# Patient Record
Sex: Female | Born: 1980 | Race: White | Hispanic: No | Marital: Married | Smoking: Never smoker
Health system: Southern US, Community
[De-identification: ages and names within clinical notes are randomized; demographics above are authoritative.]

## PROBLEM LIST (undated history)

## (undated) DIAGNOSIS — N390 Urinary tract infection, site not specified: Secondary | ICD-10-CM

## (undated) DIAGNOSIS — K429 Umbilical hernia without obstruction or gangrene: Secondary | ICD-10-CM

## (undated) DIAGNOSIS — G43909 Migraine, unspecified, not intractable, without status migrainosus: Secondary | ICD-10-CM

## (undated) DIAGNOSIS — F419 Anxiety disorder, unspecified: Secondary | ICD-10-CM

## (undated) HISTORY — DX: Migraine, unspecified, not intractable, without status migrainosus: G43.909

## (undated) HISTORY — PX: SHOULDER SURGERY: SHX246

## (undated) HISTORY — DX: Umbilical hernia without obstruction or gangrene: K42.9

## (undated) HISTORY — DX: Anxiety disorder, unspecified: F41.9

## (undated) HISTORY — DX: Urinary tract infection, site not specified: N39.0

## (undated) HISTORY — PX: HAND SURGERY: SHX662

## (undated) HISTORY — PX: TUBAL LIGATION: SHX77

## (undated) HISTORY — PX: ABDOMINAL SURGERY: SHX537

---
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
## 2004-01-24 ENCOUNTER — Ambulatory Visit (HOSPITAL_COMMUNITY): Admission: RE | Admit: 2004-01-24 | Discharge: 2004-01-24 | Payer: Self-pay | Admitting: Family Medicine

## 2004-03-19 ENCOUNTER — Ambulatory Visit (HOSPITAL_BASED_OUTPATIENT_CLINIC_OR_DEPARTMENT_OTHER): Admission: RE | Admit: 2004-03-19 | Discharge: 2004-03-19 | Payer: Self-pay | Admitting: Orthopedic Surgery

## 2015-12-12 ENCOUNTER — Encounter: Payer: Self-pay | Admitting: *Deleted

## 2015-12-25 ENCOUNTER — Ambulatory Visit: Payer: Self-pay | Admitting: General Surgery

## 2016-01-02 ENCOUNTER — Encounter: Payer: Self-pay | Admitting: General Surgery

## 2016-01-02 ENCOUNTER — Ambulatory Visit (INDEPENDENT_AMBULATORY_CARE_PROVIDER_SITE_OTHER): Payer: BLUE CROSS/BLUE SHIELD | Admitting: General Surgery

## 2016-01-02 VITALS — BP 128/86 | HR 82 | Resp 12 | Ht 65.0 in | Wt 129.0 lb

## 2016-01-02 DIAGNOSIS — K429 Umbilical hernia without obstruction or gangrene: Secondary | ICD-10-CM | POA: Diagnosis not present

## 2016-01-02 DIAGNOSIS — K469 Unspecified abdominal hernia without obstruction or gangrene: Secondary | ICD-10-CM | POA: Insufficient documentation

## 2016-01-02 NOTE — Patient Instructions (Addendum)
The patient is aware to call back for any questions or concerns. Hernia, Adult A hernia is the bulging of an organ or tissue through a weak spot in the muscles of the abdomen (abdominal wall). Hernias develop most often near the navel or groin. There are many kinds of hernias. Common kinds include:  Femoral hernia. This kind of hernia develops under the groin in the upper thigh area.  Inguinal hernia. This kind of hernia develops in the groin or scrotum.  Umbilical hernia. This kind of hernia develops near the navel.  Hiatal hernia. This kind of hernia causes part of the stomach to be pushed up into the chest.  Incisional hernia. This kind of hernia bulges through a scar from an abdominal surgery. CAUSES This condition may be caused by:  Heavy lifting.  Coughing over a long period of time.  Straining to have a bowel movement.  An incision made during an abdominal surgery.  A birth defect (congenital defect).  Excess weight or obesity.  Smoking.  Poor nutrition.  Cystic fibrosis.  Excess fluid in the abdomen.  Undescended testicles. SYMPTOMS Symptoms of a hernia include:  A lump on the abdomen. This is the first sign of a hernia. The lump may become more obvious with standing, straining, or coughing. It may get bigger over time if it is not treated or if the condition causing it is not treated.  Pain. A hernia is usually painless, but it may become painful over time if treatment is delayed. The pain is usually dull and may get worse with standing or lifting heavy objects. Sometimes a hernia gets tightly squeezed in the weak spot (strangulated) or stuck there (incarcerated) and causes additional symptoms. These symptoms may include:  Vomiting.  Nausea.  Constipation.  Irritability. DIAGNOSIS A hernia may be diagnosed with:  A physical exam. During the exam your health care provider may ask you to cough or to make a specific movement, because a hernia is usually  more visible when you move.  Imaging tests. These can include:  X-rays.  Ultrasound.  CT scan. TREATMENT A hernia that is small and painless may not need to be treated. A hernia that is large or painful may be treated with surgery. Inguinal hernias may be treated with surgery to prevent incarceration or strangulation. Strangulated hernias are always treated with surgery, because lack of blood to the trapped organ or tissue can cause it to die. Surgery to treat a hernia involves pushing the bulge back into place and repairing the weak part of the abdomen. HOME CARE INSTRUCTIONS  Avoid straining.  Do not lift anything heavier than 10 lb (4.5 kg).  Lift with your leg muscles, not your back muscles. This helps avoid strain.  When coughing, try to cough gently.  Prevent constipation. Constipation leads to straining with bowel movements, which can make a hernia worse or cause a hernia repair to break down. You can prevent constipation by:  Eating a high-fiber diet that includes plenty of fruits and vegetables.  Drinking enough fluids to keep your urine clear or pale yellow. Aim to drink 6-8 glasses of water per day.  Using a stool softener as directed by your health care provider.  Lose weight, if you are overweight.  Do not use any tobacco products, including cigarettes, chewing tobacco, or electronic cigarettes. If you need help quitting, ask your health care provider.  Keep all follow-up visits as directed by your health care provider. This is important. Your health care provider may   need to monitor your condition. SEEK MEDICAL CARE IF:  You have swelling, redness, and pain in the affected area.  Your bowel habits change. SEEK IMMEDIATE MEDICAL CARE IF:  You have a fever.  You have abdominal pain that is getting worse.  You feel nauseous or you vomit.  You cannot push the hernia back in place by gently pressing on it while you are lying down.  The hernia:  Changes in  shape or size.  Is stuck outside the abdomen.  Becomes discolored.  Feels hard or tender.   This information is not intended to replace advice given to you by your health care provider. Make sure you discuss any questions you have with your health care provider.   Document Released: 09/16/2005 Document Revised: 10/07/2014 Document Reviewed: 07/27/2014 Elsevier Interactive Patient Education 2016 [employer]  

## 2016-01-02 NOTE — Progress Notes (Signed)
Patient ID: Colleen DownsCaroline C Taylor, female   DOB: 09-02-1981, 35 y.o.   MRN: [id number]  Chief Complaint  Patient presents with  . Hernia    HPI Colleen Taylor is a 35 y.o. female.  Here today for evaluation of an umbilical hernia. She noticed this bulge after her 2 pregnancy in 2013. She did have some pain postpartum but none since. Bowels are regular. She has 4 children- 1 set of twins with this last pregnancy 2014. She feels it may be less noticeable. She is considering a tummy tuck in the future.  HPI  Past Medical History  Diagnosis Date  . Anxiety     Past Surgical History  Procedure Laterality Date  . Cesarean section  2010, 2014  . Breast reduction surgery  2002    No family history on file.  Social History Social History  Substance Use Topics  . Smoking status: Never Smoker   . Smokeless tobacco: Never Used  . Alcohol Use: 0.0 oz/week    0 Standard drinks or equivalent per week     Comment: occasionally    No Known Allergies  Current Outpatient Prescriptions  Medication Sig Dispense Refill  . LORazepam (ATIVAN) 0.5 MG tablet Take 0.5 mg by mouth every 12 (twelve) hours as needed for anxiety.    . Multiple Vitamin (MULTIVITAMIN) capsule Take 1 capsule by mouth daily.     No current facility-administered medications for this visit.    Review of Systems Review of Systems  Constitutional: Negative.   Respiratory: Negative.   Cardiovascular: Negative.     Blood pressure 128/86, pulse 82, resp. rate 12, height 5\' 5"  (1.651 m), weight 129 lb (58.514 kg), last menstrual period 12/13/2015.  Physical Exam Physical Exam  Constitutional: She is oriented to person, place, and time. She appears well-developed and well-nourished.  HENT:  Mouth/Throat: Oropharynx is clear and moist.  Eyes: Conjunctivae are normal. No scleral icterus.  Neck: Neck supple.  Cardiovascular: Normal rate, regular rhythm and normal heart sounds.   Pulmonary/Chest: Breath sounds normal.   Abdominal: Soft. Normal appearance and bowel sounds are normal. There is no tenderness. A hernia is present.    Umbilical hernia present.  Lymphadenopathy:    She has no cervical adenopathy.  Neurological: She is oriented to person, place, and time.  Skin: Skin is warm and dry.  Psychiatric: Her behavior is normal.     Assessment    Small epigastric/umbilical hernia. No risk for incarceration.    Plan    The patient is going to investigate removal of the redundant skin. If this is undertaken, its possible at the small fascial defect could be closed at that time. If she elects not to undergo plastic surgery and she desires to have the hernia repaired she'll be contacting the office for surgical scheduling.      Dr. Meriam Spragueoan for plastic surgery consult.   PCP:  No Pcp Per Patient Ref Helmut Musterlicia Copland  This information has been scribed by Dorathy DaftMarsha Hatch RN, BSN,BC.     [name], Kohan Azizi W 01/02/2016, 4:07 PM

## 2016-01-06 DIAGNOSIS — J029 Acute pharyngitis, unspecified: Secondary | ICD-10-CM | POA: Diagnosis not present

## 2016-01-06 DIAGNOSIS — J0140 Acute pansinusitis, unspecified: Secondary | ICD-10-CM | POA: Diagnosis not present

## 2016-09-30 HISTORY — PX: OTHER SURGICAL HISTORY: SHX169

## 2016-11-13 DIAGNOSIS — J019 Acute sinusitis, unspecified: Secondary | ICD-10-CM | POA: Diagnosis not present

## 2016-12-13 ENCOUNTER — Ambulatory Visit: Payer: BLUE CROSS/BLUE SHIELD | Admitting: Obstetrics and Gynecology

## 2017-01-14 ENCOUNTER — Encounter: Payer: Self-pay | Admitting: Obstetrics and Gynecology

## 2017-01-14 ENCOUNTER — Ambulatory Visit (INDEPENDENT_AMBULATORY_CARE_PROVIDER_SITE_OTHER): Payer: BLUE CROSS/BLUE SHIELD | Admitting: Obstetrics and Gynecology

## 2017-01-14 VITALS — BP 120/80 | HR 80 | Ht 65.0 in | Wt 135.0 lb

## 2017-01-14 DIAGNOSIS — R1032 Left lower quadrant pain: Secondary | ICD-10-CM

## 2017-01-14 DIAGNOSIS — N6310 Unspecified lump in the right breast, unspecified quadrant: Secondary | ICD-10-CM

## 2017-01-14 DIAGNOSIS — Z01419 Encounter for gynecological examination (general) (routine) without abnormal findings: Secondary | ICD-10-CM

## 2017-01-14 DIAGNOSIS — O30009 Twin pregnancy, unspecified number of placenta and unspecified number of amniotic sacs, unspecified trimester: Secondary | ICD-10-CM | POA: Insufficient documentation

## 2017-01-14 NOTE — Progress Notes (Addendum)
Chief Complaint  Patient presents with  . Gynecologic Exam     HPI:      Ms. Colleen Taylor is a 36 y.o. G3P3 who LMP was Patient's last menstrual period was 12/22/2016., presents today for her annual examination.  Her menses are regular every 28-30 days, lasting 8 days max total (has some light spotting at end of cycle).  Dysmenorrhea none. She does not have intermenstrual bleeding.  Sex activity: single partner, contraception - tubal ligation.  Last Pap: December 11, 2015  Results were: no abnormalities /neg HPV DNA  Hx of STDs: none  There is no FH of breast cancer. There is no FH of ovarian cancer. The patient does not do self-breast exams. 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  Tobacco use: The patient denies current or previous tobacco use. Alcohol use: social drinker Exercise: moderately active  She does get adequate calcium and Vitamin D in her diet.  She is s/p umb hernial repair and "tummy tuck" 1/18 with Dr. Lemar Livings. She has done really well. She sometimes has muscle pulling sensation BLQ when she gets up too quickly. She also notes occas, intermittent LLQ throbbing pain for about a day randomly. She has not kept journal to see if correlates with her cycle.   Past Medical History:  Diagnosis Date  . Anxiety     Past Surgical History:  Procedure Laterality Date  . ABDOMINAL SURGERY    . BREAST REDUCTION SURGERY  2002  . CESAREAN SECTION  2010, 2014  . HAND SURGERY    . SHOULDER SURGERY    . TUBAL LIGATION      No family history on file.  Social History   Social History  . Marital status: Married    Spouse name: N/A  . Number of children: N/A  . Years of education: N/A   Occupational History  . Not on file.   Social History Main Topics  . Smoking status: Never Smoker  . Smokeless tobacco: Never Used  . Alcohol use 0.0 oz/week     Comment: occasionally  . Drug use: No  . Sexual activity: Yes    Birth control/  protection: Surgical   Other Topics Concern  . Not on file   Social History Narrative  . No narrative on file     Current Outpatient Prescriptions:  .  LORazepam (ATIVAN) 0.5 MG tablet, Take 0.5 mg by mouth every 12 (twelve) hours as needed for anxiety., Disp: , Rfl:  .  Multiple Vitamin (MULTIVITAMIN) capsule, Take 1 capsule by mouth daily., Disp: , Rfl:   ROS:  Review of Systems  Constitutional: Negative for fever, malaise/fatigue and weight loss.  HENT: Negative for congestion, ear pain and sinus pain.   Respiratory: Negative for cough, shortness of breath and wheezing.   Cardiovascular: Negative for chest pain, orthopnea and leg swelling.  Gastrointestinal: Negative for constipation, diarrhea, nausea and vomiting.  Genitourinary: Negative for dysuria, frequency, hematuria and urgency.       Breast ROS: mass   Musculoskeletal: Negative for back pain, joint pain and myalgias.  Skin: Negative for itching and rash.  Neurological: Negative for dizziness, tingling, focal weakness and headaches.  Endo/Heme/Allergies: Negative for environmental allergies. Does not bruise/bleed easily.  Psychiatric/Behavioral: Negative for depression and suicidal ideas. The patient is not nervous/anxious and does not have insomnia.     Objective: BP 120/80   Pulse 80   Ht  (1.651 m)  Wt 135 lb (61.2 kg)   LMP 12/22/2016   BMI 22.47 kg/m    Physical Exam  Constitutional: She is oriented to person, place, and time. She appears well-developed and well-nourished.  Genitourinary: Vagina normal and uterus normal. No erythema or tenderness in the vagina. No vaginal discharge found. Right adnexum does not display mass and does not display tenderness. Left adnexum does not display mass and does not display tenderness. Cervix does not exhibit motion tenderness or polyp. Uterus is not enlarged or tender.  Neck: Normal range of motion. No thyromegaly present.  Cardiovascular: Normal rate, regular  rhythm and normal heart sounds.   No murmur heard. Pulmonary/Chest: Effort normal and breath sounds normal. Right breast exhibits mass. Right breast exhibits no nipple discharge, no skin change and no tenderness. Left breast exhibits no mass, no nipple discharge, no skin change and no tenderness.    RT breast 2:00 position with 1 cm firm, mobile, NT mass (pt states there is no change)  Abdominal: Soft. There is no tenderness. There is no guarding.  Musculoskeletal: Normal range of motion.  Neurological: She is alert and oriented to person, place, and time. No cranial nerve deficit.  Psychiatric: She has a normal mood and affect. Her behavior is normal.  Vitals reviewed.   Assessment/Plan: Encounter for annual routine gynecological examination  LLQ pain - Sx last about a day intermittently. See if correlates with ovulation. F/u prn sx for u/s.  Breast mass, right - Stable RT breast fiboradenoma. F/u prn changes.             GYN counsel adequate intake of calcium and vitamin D, diet and exercise     F/U  Return in about 1 year (around 01/14/2018).  Rien Marland B. Rayshell Goecke, PA-C 01/15/2017 1:50 PM

## 2017-12-08 ENCOUNTER — Encounter: Payer: Self-pay | Admitting: Obstetrics and Gynecology

## 2017-12-08 ENCOUNTER — Ambulatory Visit: Payer: BLUE CROSS/BLUE SHIELD | Admitting: Obstetrics and Gynecology

## 2017-12-08 VITALS — BP 110/84 | HR 87 | Ht 65.0 in | Wt 138.0 lb

## 2017-12-08 DIAGNOSIS — G43839 Menstrual migraine, intractable, without status migrainosus: Secondary | ICD-10-CM | POA: Diagnosis not present

## 2017-12-08 NOTE — Progress Notes (Signed)
Chief Complaint  Patient presents with  . Menstrual Problem    Migraines     HPI:      Ms. Colleen Taylor is a 37 y.o. G3P3 who LMP was Patient's last menstrual period was 11/13/2017 (exact date)., presents today for menstrual migraines for the past yr, but they're getting more intense. She notes sx either with start of menses or around day 3-5 of cycle. Has n/v/photo/phonophobia.  She takes NSAIDs without relief. Sx are about 1-2 days and can be debilitating to pt. She notes improvement with exercise throughout the month/increased fluids with menses but this isn't always feasible.She has had headaches in the past, but not migraines. She did OCPs in the past with increased headaches. FH migraines in her mom.    Past Medical History:  Diagnosis Date  . Anxiety   . Umbilical hernia     Past Surgical History:  Procedure Laterality Date  . ABDOMINAL SURGERY    . BREAST REDUCTION SURGERY  2002  . CESAREAN SECTION  2010, 2014  . HAND SURGERY    . SHOULDER SURGERY    . TUBAL LIGATION      Family History  Problem Relation Age of Onset  . Hypertension Mother   . Hypertension Father     Social History   Socioeconomic History  . Marital status: Married    Spouse name: Not on file  . Number of children: Not on file  . Years of education: Not on file  . Highest education level: Not on file  Social Needs  . Financial resource strain: Not on file  . Food insecurity - worry: Not on file  . Food insecurity - inability: Not on file  . Transportation needs - medical: Not on file  . Transportation needs - non-medical: Not on file  Occupational History  . Not on file  Tobacco Use  . Smoking status: Never Smoker  . Smokeless tobacco: Never Used  Substance and Sexual Activity  . Alcohol use: Yes    Alcohol/week: 0.0 oz    Comment: occasionally  . Drug use: No  . Sexual activity: Yes    Birth control/protection: Surgical    Comment: Tubal  Other Topics Concern  . Not on file    Social History Narrative  . Not on file     Current Outpatient Medications:  .  LORazepam (ATIVAN) 0.5 MG tablet, Take 0.5 mg by mouth every 12 (twelve) hours as needed for anxiety., Disp: , Rfl:  .  Multiple Vitamin (MULTIVITAMIN) capsule, Take 1 capsule by mouth daily., Disp: , Rfl:    ROS:  Review of Systems  Constitutional: Negative for fatigue, fever and unexpected weight change.  Respiratory: Negative for cough, shortness of breath and wheezing.   Cardiovascular: Negative for chest pain, palpitations and leg swelling.  Gastrointestinal: Negative for blood in stool, constipation, diarrhea, nausea and vomiting.  Endocrine: Negative for cold intolerance, heat intolerance and polyuria.  Genitourinary: Negative for dyspareunia, dysuria, flank pain, frequency, genital sores, hematuria, menstrual problem, pelvic pain, urgency, vaginal bleeding, vaginal discharge and vaginal pain.  Musculoskeletal: Negative for back pain, joint swelling and myalgias.  Skin: Negative for rash.  Neurological: Positive for headaches. Negative for dizziness, syncope, light-headedness and numbness.  Hematological: Negative for adenopathy.  Psychiatric/Behavioral: Negative for agitation, confusion, sleep disturbance and suicidal ideas. The patient is not nervous/anxious.      OBJECTIVE:   Vitals:  BP 110/84   Pulse 87   Ht 5\' 5"  (1.651 m)  Wt 138 lb (62.6 kg)   LMP 11/13/2017 (Exact Date)   BMI 22.96 kg/m   Physical Exam  Constitutional: She is oriented to person, place, and time and well-developed, well-nourished, and in no distress.  Pulmonary/Chest: Effort normal.  Musculoskeletal: Normal range of motion.  Neurological: She is alert and oriented to person, place, and time.  Psychiatric: Memory, affect and judgment normal.  Vitals reviewed.  Assessment/Plan: Intractable menstrual migraine without status migrainosus - Given new onset of sx, suggested pt have PCP eval. Had increased  headaches in past with OCPs. Prob better treated with triptan. Add Mg supp/sleep hygiene.    Return if symptoms worsen or fail to improve.  Zethan Alfieri B. Zyanya Glaza, PA-C 12/08/2017 3:23 PM

## 2017-12-08 NOTE — Patient Instructions (Signed)
I value your feedback and entrusting us with your care. If you get a [facility] patient survey, I would appreciate you taking the time to let us know about your experience today. Thank you! 

## 2017-12-31 ENCOUNTER — Encounter: Payer: Self-pay | Admitting: Family Medicine

## 2017-12-31 ENCOUNTER — Ambulatory Visit: Payer: BLUE CROSS/BLUE SHIELD | Admitting: Family Medicine

## 2017-12-31 VITALS — BP 116/70 | HR 87 | Temp 98.6°F | Ht 64.0 in | Wt 139.0 lb

## 2017-12-31 DIAGNOSIS — G43009 Migraine without aura, not intractable, without status migrainosus: Secondary | ICD-10-CM

## 2017-12-31 DIAGNOSIS — B001 Herpesviral vesicular dermatitis: Secondary | ICD-10-CM

## 2017-12-31 DIAGNOSIS — Z7689 Persons encountering health services in other specified circumstances: Secondary | ICD-10-CM | POA: Diagnosis not present

## 2017-12-31 DIAGNOSIS — E559 Vitamin D deficiency, unspecified: Secondary | ICD-10-CM | POA: Insufficient documentation

## 2017-12-31 LAB — CBC WITH DIFFERENTIAL/PLATELET
BASOS PCT: 0.3 % (ref 0.0–3.0)
Basophils Absolute: 0 10*3/uL (ref 0.0–0.1)
EOS PCT: 1 % (ref 0.0–5.0)
Eosinophils Absolute: 0 10*3/uL (ref 0.0–0.7)
HCT: 40 % (ref 36.0–46.0)
HEMOGLOBIN: 13.9 g/dL (ref 12.0–15.0)
Lymphocytes Relative: 35.7 % (ref 12.0–46.0)
Lymphs Abs: 1.3 10*3/uL (ref 0.7–4.0)
MCHC: 34.8 g/dL (ref 30.0–36.0)
MCV: 89.5 fl (ref 78.0–100.0)
MONOS PCT: 7.2 % (ref 3.0–12.0)
Monocytes Absolute: 0.3 10*3/uL (ref 0.1–1.0)
Neutro Abs: 2.1 10*3/uL (ref 1.4–7.7)
Neutrophils Relative %: 55.8 % (ref 43.0–77.0)
Platelets: 219 10*3/uL (ref 150.0–400.0)
RBC: 4.48 Mil/uL (ref 3.87–5.11)
RDW: 12.6 % (ref 11.5–15.5)
WBC: 3.7 10*3/uL — AB (ref 4.0–10.5)

## 2017-12-31 LAB — COMPREHENSIVE METABOLIC PANEL
ALBUMIN: 4.1 g/dL (ref 3.5–5.2)
ALK PHOS: 43 U/L (ref 39–117)
ALT: 13 U/L (ref 0–35)
AST: 20 U/L (ref 0–37)
BUN: 17 mg/dL (ref 6–23)
CALCIUM: 9.5 mg/dL (ref 8.4–10.5)
CO2: 28 mEq/L (ref 19–32)
Chloride: 106 mEq/L (ref 96–112)
Creatinine, Ser: 0.81 mg/dL (ref 0.40–1.20)
GFR: 85.19 mL/min (ref >60.00)
Glucose, Bld: 80 mg/dL (ref 70–99)
POTASSIUM: 4.3 meq/L (ref 3.5–5.1)
Sodium: 140 mEq/L (ref 135–145)
TOTAL PROTEIN: 7.3 g/dL (ref 6.0–8.3)
Total Bilirubin: 0.3 mg/dL (ref 0.2–1.2)

## 2017-12-31 LAB — VITAMIN D 25 HYDROXY (VIT D DEFICIENCY, FRACTURES): VITD: 23.78 ng/mL — AB (ref 30.00–100.00)

## 2017-12-31 LAB — TSH: TSH: 1.47 u[IU]/mL (ref 0.35–4.50)

## 2017-12-31 MED ORDER — SUMATRIPTAN SUCCINATE 50 MG PO TABS: 50.0000 mg | ORAL_TABLET | Freq: Once | ORAL | 1 refills | Status: DC

## 2017-12-31 MED ORDER — VALACYCLOVIR HCL 1 G PO TABS
2000.0000 mg | ORAL_TABLET | Freq: Two times a day (BID) | ORAL | 1 refills | Status: DC
Start: 2017-12-31 — End: 2019-04-30

## 2017-12-31 MED ORDER — ONDANSETRON 8 MG PO TBDP: 8.0000 mg | ORAL_TABLET | Freq: Three times a day (TID) | ORAL | 0 refills | Status: DC | PRN

## 2017-12-31 NOTE — Progress Notes (Signed)
Subjective:    Patient ID: Colleen Taylor, female    DOB: 07-Jul-1981, 37 y.o.   MRN: [id number]  HPI This is a 37 yo female who presents today to establish care. She is married. Has 36 yo, 36 yo, 36 yo twins. Enjoys running, yard work.   Last CPE- sees gyn annually Pap- 12/11/15- negative HPV Eye- this year Dental-regular Exercise- most days  She has been having increasing intensity and frequency of menstrual migraines. Has had monthly for the last 6 months. Gyn suggested magnesium supplementation, increased water and exercise. Has used Excedrin Migraine with some relief. Increasing magnesium seemed to help. No aura, headache always right temple. Nausea worsening with episodes. Photo/phono sensitivity. Worse on OCPs. Headaches 1-2 days out of the month. Headaches usually come on during the day. Debilitating. Occasionally relieved with sleep. Improved with adequate sleep and stress management.   Occasionally gets cold sore on lips. Worse when under stress.   Past Medical History:  Diagnosis Date  . Anxiety   . Migraines   . Umbilical hernia   . UTI (urinary tract infection)    Past Surgical History:  Procedure Laterality Date  . ABDOMINAL SURGERY    . BREAST REDUCTION SURGERY  2002  . CESAREAN SECTION  2010, 2014  . HAND SURGERY    . SHOULDER SURGERY    . TUBAL LIGATION     Family History  Problem Relation Age of Onset  . Hypertension Mother   . Hypertension Father   . Hearing loss Father   . Hypertension Sister   . Depression Sister   . Supraventricular tachycardia Sister   . Miscarriages / Stillbirths Sister   . Supraventricular tachycardia Maternal Aunt   . Supraventricular tachycardia Maternal Uncle   . Supraventricular tachycardia Paternal Aunt   . Supraventricular tachycardia Paternal Uncle   . Cancer Paternal Grandfather   . Hearing loss Paternal Grandfather    Social History   Tobacco Use  . Smoking status: Never Smoker  . Smokeless tobacco: Never Used    Substance Use Topics  . Alcohol use: Yes    Alcohol/week: 0.0 oz    Comment: occasionally  . Drug use: No      Review of Systems Per HPI    Objective:   Physical Exam  Constitutional: She is oriented to person, place, and time. She appears well-developed and well-nourished. No distress.  HENT:  Head: Normocephalic and atraumatic.  Mouth/Throat: Oropharynx is clear and moist.  Eyes: Pupils are equal, round, and reactive to light. Conjunctivae and EOM are normal. Right eye exhibits no discharge. Left eye exhibits no discharge.  Neck: Normal range of motion. Neck supple. No thyromegaly present.  Cardiovascular: Normal rate, regular rhythm and normal heart sounds.  Pulmonary/Chest: Effort normal and breath sounds normal.  Musculoskeletal: Normal range of motion. She exhibits no edema.  Lymphadenopathy:    She has no cervical adenopathy.  Neurological: She is alert and oriented to person, place, and time. She displays normal reflexes. No cranial nerve deficit. Coordination normal.  Skin: Skin is warm and dry. She is not diaphoretic.  Left upper and lower lip with resolving erythematous vesicles.   Psychiatric: She has a normal mood and affect. Her behavior is normal. Judgment and thought content normal.  Vitals reviewed.        BP 116/70   Pulse 87   Temp 98.6 F (37 C) (Oral)   Ht 5\' 4"  (1.626 m)   Wt 139 lb (63 kg)   SpO2  98%   BMI 23.86 kg/m   Assessment & Plan:  1. Encounter to establish care - Discussed and encouraged healthy lifestyle choices- adequate sleep, regular exercise, stress management and healthy food choices.    2. Migraine without aura and without status migrainosus, not intractable - Provided written and verbal information regarding diagnosis and treatment. - will try abortive medication and if no adequate relief, will consider daily preventative - I have asked her to keep a headache log and follow up in 6 months - SUMAtriptan (IMITREX) 50 MG  tablet; Take 1 tablet (50 mg total) by mouth once for 1 dose. May repeat in 2 hours if needed. Do not take more than 2 tablets in 24 hours.  Dispense: 10 tablet; Refill: 1 - ondansetron (ZOFRAN-ODT) 8 MG disintegrating tablet; Take 1 tablet (8 mg total) by mouth every 8 (eight) hours as needed for nausea.  Dispense: 20 tablet; Refill: 0 - CBC with Differential - Comprehensive metabolic panel - TSH - Vitamin D, 25-hydroxy  3. Recurrent cold sores - valACYclovir (VALTREX) 1000 MG tablet; Take 2 tablets (2,000 mg total) by mouth 2 (two) times daily. For one day.  Dispense: 20 tablet; Refill: 1   Colleen Ree, FNP-BC  [facility], [facility]  12/31/2017 2:00 PM

## 2017-12-31 NOTE — Patient Instructions (Signed)
Nice to see you today  I have sent in two prescriptions for you to use when you have a migraine- sumatriptan is to abort the headache, ondansetron is for nausea (may constipate you) You can also try two Alleve (naprosyn) with sumatriptan   I have sent in a medicine to use for your cold sores. Use as soon as you feel it starting. It is two tablets twice a day for 1 day.   Please keep a headache log and follow up with me in 6-8 months. Let me know if medication not working.   Sumatriptan tablets What is this medicine? SUMATRIPTAN (soo ma TRIP tan) is used to treat migraines with or without aura. An aura is a strange feeling or visual disturbance that warns you of an attack. It is not used to prevent migraines. This medicine may be used for other purposes; ask your health care provider or pharmacist if you have questions. COMMON BRAND NAME(S): Imitrex, Migraine Pack What should I tell my health care provider before I take this medicine? They need to know if you have any of these conditions: -circulation problems in fingers and toes -diabetes -heart disease -high blood pressure -high cholesterol -history of irregular heartbeat -history of stroke -kidney disease -liver disease -postmenopausal or surgical removal of uterus and ovaries -seizures -smoke tobacco -stomach or intestine problems -an unusual or allergic reaction to sumatriptan, other medicines, foods, dyes, or preservatives -pregnant or trying to get pregnant -breast-feeding How should I use this medicine? Take this medicine by mouth with a glass of water. Follow the directions on the prescription label. This medicine is taken at the first symptoms of a migraine. It is not for everyday use. If your migraine headache returns after one dose, you can take another dose as directed. You must leave at least 2 hours between doses, and do not take more than 100 mg as a single dose. Do not take more than 200 mg total in any 24 hour  period. If there is no improvement at all after the first dose, do not take a second dose without talking to your doctor or health care professional. Do not take your medicine more often than directed. Talk to your pediatrician regarding the use of this medicine in children. Special care may be needed. Overdosage: If you think you have taken too much of this medicine contact a poison control center or emergency room at once. NOTE: This medicine is only for you. Do not share this medicine with others. What if I miss a dose? This does not apply; this medicine is not for regular use. What may interact with this medicine? Do not take this medicine with any of the following medicines: -cocaine -ergot alkaloids like dihydroergotamine, ergonovine, ergotamine, methylergonovine -feverfew -MAOIs like Carbex, Eldepryl, Marplan, Nardil, and Parnate -other medicines for migraine headache like almotriptan, eletriptan, frovatriptan, naratriptan, rizatriptan, zolmitriptan -tryptophan This medicine may also interact with the following medications: -certain medicines for depression, anxiety, or psychotic disturbances This list may not describe all possible interactions. Give your health care provider a list of all the medicines, herbs, non-prescription drugs, or dietary supplements you use. Also tell them if you smoke, drink alcohol, or use illegal drugs. Some items may interact with your medicine. What should I watch for while using this medicine? Only take this medicine for a migraine headache. Take it if you get warning symptoms or at the start of a migraine attack. It is not for regular use to prevent migraine attacks. You may get  drowsy or dizzy. Do not drive, use machinery, or do anything that needs mental alertness until you know how this medicine affects you. To reduce dizzy or fainting spells, do not sit or stand up quickly, especially if you are an older patient. Alcohol can increase drowsiness, dizziness  and flushing. Avoid alcoholic drinks. Smoking cigarettes may increase the risk of heart-related side effects from using this medicine. If you take migraine medicines for 10 or more days a month, your migraines may get worse. Keep a diary of headache days and medicine use. Contact your healthcare professional if your migraine attacks occur more frequently. What side effects may I notice from receiving this medicine? Side effects that you should report to your doctor or health care professional as soon as possible: -allergic reactions like skin rash, itching or hives, swelling of the face, lips, or tongue -bloody or watery diarrhea -hallucination, loss of contact with reality -pain, tingling, numbness in the face, hands, or feet -seizures -signs and symptoms of a blood clot such as breathing problems; changes in vision; chest pain; severe, sudden headache; pain, swelling, warmth in the leg; trouble speaking; sudden numbness or weakness of the face, arm, or leg -signs and symptoms of a dangerous change in heartbeat or heart rhythm like chest pain; dizziness; fast or irregular heartbeat; palpitations, feeling faint or lightheaded; falls; breathing problems -signs and symptoms of a stroke like changes in vision; confusion; trouble speaking or understanding; severe headaches; sudden numbness or weakness of the face, arm, or leg; trouble walking; dizziness; loss of balance or coordination -stomach pain Side effects that usually do not require medical attention (report to your doctor or health care professional if they continue or are bothersome): -changes in taste -facial flushing -headache -muscle cramps -muscle pain -nausea, vomiting -weak or tired This list may not describe all possible side effects. Call your doctor for medical advice about side effects. You may report side effects to FDA at 1-800-FDA-1088. Where should I keep my medicine? Keep out of the reach of children. Store at room  temperature between 2 and 30 degrees C (36 and 86 degrees F). Throw away any unused medicine after the expiration date. NOTE: This sheet is a summary. It may not cover all possible information. If you have questions about this medicine, talk to your doctor, pharmacist, or health care provider.  2018 Elsevier/Gold Standard (2015-10-19 12:38:23)  Migraine Headache A migraine headache is an intense, throbbing pain on one side or both sides of the head. Migraines may also cause other symptoms, such as nausea, vomiting, and sensitivity to light and noise. What are the causes? Doing or taking certain things may also trigger migraines, such as:  Alcohol.  Smoking.  Medicines, such as: ? Medicine used to treat chest pain (nitroglycerine). ? Birth control pills. ? Estrogen pills. ? Certain blood pressure medicines.  Aged cheeses, chocolate, or caffeine.  Foods or drinks that contain nitrates, glutamate, aspartame, or tyramine.  Physical activity.  Other things that may trigger a migraine include:  Menstruation.  Pregnancy.  Hunger.  Stress, lack of sleep, too much sleep, or fatigue.  Weather changes.  What increases the risk? The following factors may make you more likely to experience migraine headaches:  Age. Risk increases with age.  Family history of migraine headaches.  Being Caucasian.  Depression and anxiety.  Obesity.  Being a woman.  Having a hole in the heart (patent foramen ovale) or other heart problems.  What are the signs or symptoms? The main symptom  of this condition is pulsating or throbbing pain. Pain may:  Happen in any area of the head, such as on one side or both sides.  Interfere with daily activities.  Get worse with physical activity.  Get worse with exposure to bright lights or loud noises.  Other symptoms may include:  Nausea.  Vomiting.  Dizziness.  General sensitivity to bright lights, loud noises, or smells.  Before you  get a migraine, you may get warning signs that a migraine is developing (aura). An aura may include:  Seeing flashing lights or having blind spots.  Seeing bright spots, halos, or zigzag lines.  Having tunnel vision or blurred vision.  Having numbness or a tingling feeling.  Having trouble talking.  Having muscle weakness.  How is this diagnosed? A migraine headache can be diagnosed based on:  Your symptoms.  A physical exam.  Tests, such as CT scan or MRI of the head. These imaging tests can help rule out other causes of headaches.  Taking fluid from the spine (lumbar puncture) and analyzing it (cerebrospinal fluid analysis, or CSF analysis).  How is this treated? A migraine headache is usually treated with medicines that:  Relieve pain.  Relieve nausea.  Prevent migraines from coming back.  Treatment may also include:  Acupuncture.  Lifestyle changes like avoiding foods that trigger migraines.  Follow these instructions at home: Medicines  Take over-the-counter and prescription medicines only as told by your health care provider.  Do not drive or use heavy machinery while taking prescription pain medicine.  To prevent or treat constipation while you are taking prescription pain medicine, your health care provider may recommend that you: ? Drink enough fluid to keep your urine clear or pale yellow. ? Take over-the-counter or prescription medicines. ? Eat foods that are high in fiber, such as fresh fruits and vegetables, whole grains, and beans. ? Limit foods that are high in fat and processed sugars, such as fried and sweet foods. Lifestyle  Avoid alcohol use.  Do not use any products that contain nicotine or tobacco, such as cigarettes and e-cigarettes. If you need help quitting, ask your health care provider.  Get at least 8 hours of sleep every night.  Limit your stress. General instructions   Keep a journal to find out what may trigger your  migraine headaches. For example, write down: ? What you eat and drink. ? How much sleep you get. ? Any change to your diet or medicines.  If you have a migraine: ? Avoid things that make your symptoms worse, such as bright lights. ? It may help to lie down in a dark, quiet room. ? Do not drive or use heavy machinery. ? Ask your health care provider what activities are safe for you while you are experiencing symptoms.  Keep all follow-up visits as told by your health care provider. This is important. Contact a health care provider if:  You develop symptoms that are different or more severe than your usual migraine symptoms. Get help right away if:  Your migraine becomes severe.  You have a fever.  You have a stiff neck.  You have vision loss.  Your muscles feel weak or like you cannot control them.  You start to lose your balance often.  You develop trouble walking.  You faint. This information is not intended to replace advice given to you by your health care provider. Make sure you discuss any questions you have with your health care provider. Document Released: 09/16/2005  Document Revised: 04/05/2016 Document Reviewed: 03/04/2016 Elsevier Interactive Patient Education  2017 [employer].

## 2018-03-20 DIAGNOSIS — M9901 Segmental and somatic dysfunction of cervical region: Secondary | ICD-10-CM | POA: Diagnosis not present

## 2018-03-20 DIAGNOSIS — M531 Cervicobrachial syndrome: Secondary | ICD-10-CM | POA: Diagnosis not present

## 2018-03-20 DIAGNOSIS — M9902 Segmental and somatic dysfunction of thoracic region: Secondary | ICD-10-CM | POA: Diagnosis not present

## 2018-04-15 DIAGNOSIS — M9901 Segmental and somatic dysfunction of cervical region: Secondary | ICD-10-CM | POA: Diagnosis not present

## 2018-04-15 DIAGNOSIS — M531 Cervicobrachial syndrome: Secondary | ICD-10-CM | POA: Diagnosis not present

## 2018-04-15 DIAGNOSIS — M9902 Segmental and somatic dysfunction of thoracic region: Secondary | ICD-10-CM | POA: Diagnosis not present

## 2018-07-13 DIAGNOSIS — M531 Cervicobrachial syndrome: Secondary | ICD-10-CM | POA: Diagnosis not present

## 2018-07-13 DIAGNOSIS — M9901 Segmental and somatic dysfunction of cervical region: Secondary | ICD-10-CM | POA: Diagnosis not present

## 2018-07-13 DIAGNOSIS — M9902 Segmental and somatic dysfunction of thoracic region: Secondary | ICD-10-CM | POA: Diagnosis not present

## 2018-10-24 DIAGNOSIS — J0140 Acute pansinusitis, unspecified: Secondary | ICD-10-CM | POA: Diagnosis not present

## 2019-01-13 ENCOUNTER — Ambulatory Visit: Payer: BLUE CROSS/BLUE SHIELD | Admitting: Family Medicine

## 2019-01-13 ENCOUNTER — Encounter: Payer: Self-pay | Admitting: Family Medicine

## 2019-01-13 ENCOUNTER — Other Ambulatory Visit: Payer: Self-pay

## 2019-01-13 VITALS — BP 112/68 | HR 83 | Temp 98.5°F | Ht 64.0 in | Wt 143.3 lb

## 2019-01-13 DIAGNOSIS — S6990XA Unspecified injury of unspecified wrist, hand and finger(s), initial encounter: Secondary | ICD-10-CM | POA: Insufficient documentation

## 2019-01-13 DIAGNOSIS — S6991XA Unspecified injury of right wrist, hand and finger(s), initial encounter: Secondary | ICD-10-CM | POA: Diagnosis not present

## 2019-01-13 DIAGNOSIS — Z23 Encounter for immunization: Secondary | ICD-10-CM

## 2019-01-13 MED ORDER — CEPHALEXIN 500 MG PO CAPS: 500.0000 mg | ORAL_CAPSULE | Freq: Three times a day (TID) | ORAL | 0 refills | Status: DC

## 2019-01-13 NOTE — Assessment & Plan Note (Signed)
Tiny fb under L medial thumb nail (from scraping crusted cheese off a dish)  Slight redness  Will tx for possible infection with keflex Enc soap and water cleanse/ avoid submerging and protect from dirt  Ice/cold compress prn  nsaid prn  Doubt she will loose nail / area is tiny and I expect it will grow out Watch for inc redness/swelling/pain or d/c and call  Tdap updated today  Update if not starting to improve in a week or if worsening

## 2019-01-13 NOTE — Patient Instructions (Signed)
Soap and water whenever you can  Cover if in a dirty environment   Do not submerge if possible   Tetanus shot today  Take keflex as directed   This should grow out  I do not expect to loose the nail  If increased redness/ pain /swelling- let us know  If drainage-also update me   Update if not starting to improve in a week or if worsening    Ice/cold compress for 10 minutes at a time is ok  nsaid like ibuprofen or aleve will help pain and inflammation

## 2019-01-13 NOTE — Progress Notes (Signed)
Subjective:    Patient ID: Colleen Taylor, female    DOB: July 27, 1981, 38 y.o.   MRN: [id number]  HPI  Here with a hand injury (right)   She was scraping a pan  Material went up and under her thumb nail (crusted cheese) Pt is 100% positive it is not metal or wood It was Monday night/tuesday   It hurt a lot initially  Did not bleed at all  It feels like a splinter under the nail  A bit of bruise under the nail - but no free bleeding   She cleaned it with soap and water   Not very red -some  A little swollen   Patient Active Problem List   Diagnosis Date Noted  . Finger injury 01/13/2019  . Vitamin D deficiency 12/31/2017  . 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 Twin pregnancy 01/14/2017  . Hernia of abdominal cavity 01/02/2016   Past Medical History:  Diagnosis Date  . Anxiety   . Migraines   . Umbilical hernia   . UTI (urinary tract infection)    Past Surgical History:  Procedure Laterality Date  . ABDOMINAL SURGERY    . BREAST REDUCTION SURGERY  2002  . CESAREAN SECTION  2010, 2014  . HAND SURGERY    . SHOULDER SURGERY    . TUBAL LIGATION     Social History   Tobacco Use  . Smoking status: Never Smoker  . Smokeless tobacco: Never Used  Substance Use Topics  . Alcohol use: Yes    Alcohol/week: 0.0 standard drinks    Comment: occasionally  . Drug use: No   Family History  Problem Relation Age of Onset  . Hypertension Mother   . Hypertension Father   . Hearing loss Father   . Hypertension Sister   . Depression Sister   . Supraventricular tachycardia Sister   . Miscarriages / Stillbirths Sister   . Supraventricular tachycardia Maternal Aunt   . Supraventricular tachycardia Maternal Uncle   . Supraventricular tachycardia Paternal Aunt   . Supraventricular tachycardia Paternal Uncle   . Cancer Paternal Grandfather   . Hearing loss Paternal Grandfather    No Known Allergies Current Outpatient Medications on File  Prior to Visit  Medication Sig Dispense Refill  . Multiple Vitamin (MULTIVITAMIN) capsule Take 1 capsule by mouth daily.    . SUMAtriptan (IMITREX) 50 MG tablet Take 1 tablet (50 mg total) by mouth once for 1 dose. May repeat in 2 hours if needed. Do not take more than 2 tablets in 24 hours. 10 tablet 1  . valACYclovir (VALTREX) 1000 MG tablet Take 2 tablets (2,000 mg total) by mouth 2 (two) times daily. For one day. 20 tablet 1   No current facility-administered medications on file prior to visit.     Review of Systems  Constitutional: Negative for chills, fatigue and fever.  Respiratory: Negative for cough and shortness of breath.   Cardiovascular: Negative for chest pain and palpitations.  Musculoskeletal: Negative for joint swelling.  Neurological: Negative for weakness and numbness.  Hematological: Negative for adenopathy. Does not bruise/bleed easily.       Objective:   Physical Exam Constitutional:      General: She is not in acute distress.    Appearance: Normal appearance. She is normal weight. She is not ill-appearing.  Eyes:     Extraocular Movements: Extraocular movements intact.     Conjunctiva/sclera: Conjunctivae normal.     Pupils: Pupils are equal, round, and  reactive to light.  Neck:     Musculoskeletal: Normal range of motion and neck supple.  Cardiovascular:     Rate and Rhythm: Normal rate and regular rhythm.     Pulses: Normal pulses.     Heart sounds: Normal heart sounds.  Pulmonary:     Effort: Pulmonary effort is normal. No respiratory distress.  Lymphadenopathy:     Cervical: No cervical adenopathy.  Skin:    General: Skin is warm and dry.     Findings: Erythema present.     Comments: Left thumb: Mild swelling medially  Very tiny fb (spec) seen about 1-2 mm under the nail with some surrounding erythema (1-2 mm)  Thumb if mildly tender No drainage or fluctuance  No nail interruption /it is not loose or mobile   Neurological:     General: No  focal deficit present.     Mental Status: She is alert.     Sensory: No sensory deficit.  Psychiatric:        Mood and Affect: Mood normal.           Assessment & Plan:   Problem List Items Addressed This Visit      Other   Finger injury - Primary    Tiny fb under L medial thumb nail (from scraping crusted cheese off a dish)  Slight redness  Will tx for possible infection with keflex Enc soap and water cleanse/ avoid submerging and protect from dirt  Ice/cold compress prn  nsaid prn  Doubt she will loose nail / area is tiny and I expect it will grow out Watch for inc redness/swelling/pain or d/c and call  Tdap updated today  Update if not starting to improve in a week or if worsening

## 2019-02-24 DIAGNOSIS — M9902 Segmental and somatic dysfunction of thoracic region: Secondary | ICD-10-CM | POA: Diagnosis not present

## 2019-02-24 DIAGNOSIS — M531 Cervicobrachial syndrome: Secondary | ICD-10-CM | POA: Diagnosis not present

## 2019-02-24 DIAGNOSIS — M9901 Segmental and somatic dysfunction of cervical region: Secondary | ICD-10-CM | POA: Diagnosis not present

## 2019-03-02 DIAGNOSIS — M531 Cervicobrachial syndrome: Secondary | ICD-10-CM | POA: Diagnosis not present

## 2019-03-02 DIAGNOSIS — M9902 Segmental and somatic dysfunction of thoracic region: Secondary | ICD-10-CM | POA: Diagnosis not present

## 2019-03-02 DIAGNOSIS — M9901 Segmental and somatic dysfunction of cervical region: Secondary | ICD-10-CM | POA: Diagnosis not present

## 2019-03-02 DIAGNOSIS — M25512 Pain in left shoulder: Secondary | ICD-10-CM | POA: Diagnosis not present

## 2019-04-30 ENCOUNTER — Other Ambulatory Visit: Payer: Self-pay | Admitting: Family Medicine

## 2019-04-30 DIAGNOSIS — B001 Herpesviral vesicular dermatitis: Secondary | ICD-10-CM

## 2019-04-30 DIAGNOSIS — G43009 Migraine without aura, not intractable, without status migrainosus: Secondary | ICD-10-CM

## 2019-04-30 NOTE — Telephone Encounter (Signed)
Last time both medications were filled was 12/31/2017. LOV 01/13/2019 for acute visit with Dr. Glori Bickers. Future appointment on 05/17/2019. 12/31/2017 appointment with Jackelyn Poling was for establish care.

## 2019-05-04 DIAGNOSIS — M9902 Segmental and somatic dysfunction of thoracic region: Secondary | ICD-10-CM | POA: Diagnosis not present

## 2019-05-04 DIAGNOSIS — M436 Torticollis: Secondary | ICD-10-CM | POA: Diagnosis not present

## 2019-05-04 DIAGNOSIS — M531 Cervicobrachial syndrome: Secondary | ICD-10-CM | POA: Diagnosis not present

## 2019-05-17 ENCOUNTER — Ambulatory Visit (INDEPENDENT_AMBULATORY_CARE_PROVIDER_SITE_OTHER): Payer: BC Managed Care – PPO | Admitting: Family Medicine

## 2019-05-17 ENCOUNTER — Other Ambulatory Visit: Payer: Self-pay

## 2019-05-17 ENCOUNTER — Encounter: Payer: Self-pay | Admitting: Family Medicine

## 2019-05-17 ENCOUNTER — Encounter: Payer: BLUE CROSS/BLUE SHIELD | Admitting: Family Medicine

## 2019-05-17 VITALS — BP 122/62 | HR 80 | Temp 98.5°F | Ht 64.0 in | Wt 141.8 lb

## 2019-05-17 DIAGNOSIS — E559 Vitamin D deficiency, unspecified: Secondary | ICD-10-CM

## 2019-05-17 DIAGNOSIS — G43009 Migraine without aura, not intractable, without status migrainosus: Secondary | ICD-10-CM | POA: Diagnosis not present

## 2019-05-17 DIAGNOSIS — B001 Herpesviral vesicular dermatitis: Secondary | ICD-10-CM

## 2019-05-17 DIAGNOSIS — Z13220 Encounter for screening for lipoid disorders: Secondary | ICD-10-CM

## 2019-05-17 MED ORDER — VALACYCLOVIR HCL 1 G PO TABS: 2000.0000 mg | ORAL_TABLET | Freq: Two times a day (BID) | ORAL | 1 refills | Status: DC

## 2019-05-17 MED ORDER — SUMATRIPTAN SUCCINATE 50 MG PO TABS: 50.0000 mg | ORAL_TABLET | ORAL | 2 refills | Status: DC | PRN

## 2019-05-17 NOTE — Patient Instructions (Signed)
Good to see you today  Follow up in about 1 year or sooner if needed

## 2019-05-17 NOTE — Progress Notes (Signed)
Subjective:    Patient ID: Colleen Taylor, female    DOB: 07-Feb-1981, 38 y.o.   MRN: [id number]  HPI This is a 38 yo female who presents today for follow up of migraine headaches and anxiety. Has annual follow up with gyn. UTD on Pap.   Migraines- greatly improved, exercising more regularly. Last prescription for sumatriptan lasted her all  Year. She is eating cleaner.   Anxiety- improved. 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   Cold sores- occasional outbreak on lips, usually triggered by sun, she gets good results with valacyclovir.   Vitamin D deficiency- has been taking supplement and getting more sun exposure.     Past Medical History:  Diagnosis Date  . Anxiety   . Migraines   . Umbilical hernia   . UTI (urinary tract infection)    Past Surgical History:  Procedure Laterality Date  . ABDOMINAL SURGERY    . BREAST REDUCTION SURGERY  2002  . CESAREAN SECTION  2010, 2014  . HAND SURGERY    . SHOULDER SURGERY    . TUBAL LIGATION     Family History  Problem Relation Age of Onset  . Hypertension Mother   . Hypertension Father   . Hearing loss Father   . Hypertension Sister   . Depression Sister   . Supraventricular tachycardia Sister   . Miscarriages / Stillbirths Sister   . Supraventricular tachycardia Maternal Aunt   . Supraventricular tachycardia Maternal Uncle   . Supraventricular tachycardia Paternal Aunt   . Supraventricular tachycardia Paternal Uncle   . Cancer Paternal Grandfather   . Hearing loss Paternal Grandfather    Social History   Tobacco Use  . Smoking status: Never Smoker  . Smokeless tobacco: Never Used  Substance Use Topics  . Alcohol use: Yes    Alcohol/week: 0.0 standard drinks    Comment: occasionally  . Drug use: No      Review of Systems Per HPI    Objective:   Physical Exam Vitals signs reviewed.  Constitutional:      General: She is not in  acute distress.    Appearance: Normal appearance. She is normal weight. She is not ill-appearing, toxic-appearing or diaphoretic.  HENT:     Head: Normocephalic and atraumatic.  Eyes:     Conjunctiva/sclera: Conjunctivae normal.  Cardiovascular:     Rate and Rhythm: Normal rate.  Pulmonary:     Effort: Pulmonary effort is normal.  Neurological:     Mental Status: She is alert and oriented to person, place, and time.  Psychiatric:        Mood and Affect: Mood normal.        Behavior: Behavior normal.        Thought Content: Thought content normal.        Judgment: Judgment normal.      BP 122/62   Pulse 80   Temp 98.5 F (36.9 C) (Temporal)   Ht 5\' 4"  (1.626 m)   Wt 141 lb 12 oz (64.3 kg)   SpO2 97%   BMI 24.33 kg/m  Wt Readings from Last 3 Encounters:  05/17/19 141 lb 12 oz (64.3 kg)  01/13/19 143 lb 5 oz (65 kg)  12/31/17 139 lb (63 kg)        Assessment & Plan:  1. Screening for lipid disorders - Lipid panel  2. Vitamin D deficiency - Vitamin D, 25-hydroxy  3. Migraine without aura and without status migrainosus, not intractable - SUMAtriptan (IMITREX) 50 MG tablet; Take 1 tablet (50 mg total) by mouth every 2 (two) hours as needed for migraine. May repeat in 2 hours if headache persists or recurs.  Dispense: 9 tablet; Refill: 2  4. Recurrent cold sores - valACYclovir (VALTREX) 1000 MG tablet; Take 2 tablets (2,000 mg total) by mouth 2 (two) times daily. For one day.  Dispense: 20 tablet; Refill: [place], FNP-BC  [facility]  05/18/2019 6:14 AM

## 2019-05-18 ENCOUNTER — Encounter: Payer: Self-pay | Admitting: Family Medicine

## 2019-05-18 LAB — LIPID PANEL
Cholesterol: 188 mg/dL (ref 0–200)
HDL: 55.4 mg/dL (ref >39.00)
LDL Cholesterol: 116 mg/dL — ABNORMAL HIGH (ref 0–99)
NonHDL: 132.6
Total CHOL/HDL Ratio: 3
Triglycerides: 81 mg/dL (ref 0.0–149.0)
VLDL: 16.2 mg/dL (ref 0.0–40.0)

## 2019-05-18 LAB — VITAMIN D 25 HYDROXY (VIT D DEFICIENCY, FRACTURES): VITD: 37.3 ng/mL (ref 30.00–100.00)

## 2019-07-06 DIAGNOSIS — M531 Cervicobrachial syndrome: Secondary | ICD-10-CM | POA: Diagnosis not present

## 2019-07-06 DIAGNOSIS — M436 Torticollis: Secondary | ICD-10-CM | POA: Diagnosis not present

## 2019-07-06 DIAGNOSIS — M5386 Other specified dorsopathies, lumbar region: Secondary | ICD-10-CM | POA: Diagnosis not present

## 2019-07-06 DIAGNOSIS — M9902 Segmental and somatic dysfunction of thoracic region: Secondary | ICD-10-CM | POA: Diagnosis not present

## 2019-08-09 ENCOUNTER — Ambulatory Visit (INDEPENDENT_AMBULATORY_CARE_PROVIDER_SITE_OTHER): Payer: BC Managed Care – PPO | Admitting: Family Medicine

## 2019-08-09 ENCOUNTER — Encounter: Payer: Self-pay | Admitting: Family Medicine

## 2019-08-09 VITALS — Temp 97.0°F | Ht 64.0 in | Wt 138.0 lb

## 2019-08-09 DIAGNOSIS — J302 Other seasonal allergic rhinitis: Secondary | ICD-10-CM

## 2019-08-09 NOTE — Progress Notes (Signed)
Virtual Visit via Video Note  I connected with Colleen Taylor on 08/09/19 at  4:00 PM EST by a video enabled telemedicine application and verified that I am speaking with the correct person using two identifiers.  Location: Patient: In her home Provider: [facility] Merry   I discussed the limitations of evaluation and management by telemedicine and the availability of in person appointments. The patient expressed understanding and agreed to proceed.  History of Present Illness: Chief Complaint  Patient presents with  . Sinus Problem    Pt c/o migraines, nasal congested. sore throat (worse in AM) and pressure in head x 3 days. OTC meds, Tylenol prn - no decongestants. Denies fever.    This is a 38 yo female who presents today with above cc.  Symptoms started with sore throat in the mornings that was relieved with Tylenol and drinking liquids.  She has had some pressure behind her eyes very little nasal drainage but feels like she has had drainage but feels like she is having postnasal drainage.  She has no wheeze or shortness of breath, she does have an occasional dry cough that is precipitated by a tickle in her throat.  She has not been achy but she has been a little bit fatigued.  No fever.  She has not had seasonal allergies in the past but is thinking she may be developing them.  She gets a "sinus infection," annually between November and February.  She spends a lot of time outside with her children. She does not think she has any Covid contacts.  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  Other than going to the grocery store, she has not been in contact with other people.  Past Medical History:  Diagnosis Date  . Anxiety   . Migraines   . Umbilical hernia   . UTI (urinary tract infection)    Past Surgical History:  Procedure Laterality Date  . ABDOMINAL SURGERY    . BREAST REDUCTION SURGERY  2002  . CESAREAN SECTION  2010, 2014  . HAND SURGERY    .  SHOULDER SURGERY    . TUBAL LIGATION     Family History  Problem Relation Age of Onset  . Hypertension Mother   . Hypertension Father   . Hearing loss Father   . Hypertension Sister   . Depression Sister   . Supraventricular tachycardia Sister   . Miscarriages / Stillbirths Sister   . Supraventricular tachycardia Maternal Aunt   . Supraventricular tachycardia Maternal Uncle   . Supraventricular tachycardia Paternal Aunt   . Supraventricular tachycardia Paternal Uncle   . Cancer Paternal Grandfather   . Hearing loss Paternal Grandfather    Social History   Tobacco Use  . Smoking status: Never Smoker  . Smokeless tobacco: Never Used  Substance Use Topics  . Alcohol use: Yes    Alcohol/week: 0.0 standard drinks    Comment: occasionally  . Drug use: No      Observations/Objective: Patient is alert and answers questions appropriately.  Visible skin is unremarkable.  She is normally conversive without shortness of breath, audible wheeze or witnessed cough.  Oropharynx without erythema or exudate. Her mood and affect are appropriate. Temp (!) 97 F (36.1 C) (Temporal)   Ht 5\' 4"  (1.626 m)   Wt 138 lb (62.6 kg)   BMI 23.69 kg/m   Assessment and Plan: 1. Seasonal allergic rhinitis, unspecified trigger -Reviewed working diagnosis and encouraged her to take ibuprofen  as needed pain, long-acting over-the-counter antihistamine and decongestants as needed -Reviewed return to clinic precautions including no relief in 5 to 7 days, fever, worsening headache, increased purulent nasal drainage -Encouraged her to have Covid testing if she develops any loss of taste or smell or respiratory symptoms or fever  Clarene Reamer, FNP-BC  [facility], [facility]  08/09/2019 4:16 PM   Follow Up Instructions:    I discussed the assessment and treatment plan with the patient. The patient was provided an opportunity to ask questions and all were  answered. The patient agreed with the plan and demonstrated an understanding of the instructions.   The patient was advised to call back or seek an in-person evaluation if the symptoms worsen or if the condition fails to improve as anticipated.   Elby Beck, FNP

## 2019-11-04 DIAGNOSIS — M9903 Segmental and somatic dysfunction of lumbar region: Secondary | ICD-10-CM | POA: Diagnosis not present

## 2019-11-04 DIAGNOSIS — M9904 Segmental and somatic dysfunction of sacral region: Secondary | ICD-10-CM | POA: Diagnosis not present

## 2019-11-04 DIAGNOSIS — M461 Sacroiliitis, not elsewhere classified: Secondary | ICD-10-CM | POA: Diagnosis not present

## 2019-11-11 DIAGNOSIS — M9904 Segmental and somatic dysfunction of sacral region: Secondary | ICD-10-CM | POA: Diagnosis not present

## 2019-11-11 DIAGNOSIS — M9903 Segmental and somatic dysfunction of lumbar region: Secondary | ICD-10-CM | POA: Diagnosis not present

## 2019-11-11 DIAGNOSIS — M461 Sacroiliitis, not elsewhere classified: Secondary | ICD-10-CM | POA: Diagnosis not present

## 2019-11-16 DIAGNOSIS — M9903 Segmental and somatic dysfunction of lumbar region: Secondary | ICD-10-CM | POA: Diagnosis not present

## 2019-11-16 DIAGNOSIS — M9904 Segmental and somatic dysfunction of sacral region: Secondary | ICD-10-CM | POA: Diagnosis not present

## 2019-11-16 DIAGNOSIS — M461 Sacroiliitis, not elsewhere classified: Secondary | ICD-10-CM | POA: Diagnosis not present

## 2019-11-23 DIAGNOSIS — M461 Sacroiliitis, not elsewhere classified: Secondary | ICD-10-CM | POA: Diagnosis not present

## 2019-11-23 DIAGNOSIS — M9904 Segmental and somatic dysfunction of sacral region: Secondary | ICD-10-CM | POA: Diagnosis not present

## 2019-11-23 DIAGNOSIS — M7631 Iliotibial band syndrome, right leg: Secondary | ICD-10-CM | POA: Diagnosis not present

## 2019-11-23 DIAGNOSIS — M9903 Segmental and somatic dysfunction of lumbar region: Secondary | ICD-10-CM | POA: Diagnosis not present

## 2019-11-26 ENCOUNTER — Encounter: Payer: Self-pay | Admitting: Family Medicine

## 2019-11-26 ENCOUNTER — Telehealth: Payer: Self-pay

## 2019-11-26 NOTE — Telephone Encounter (Signed)
OK for in office 

## 2019-11-26 NOTE — Telephone Encounter (Signed)
Pt called stating she has regurgitate food all her life.  She stated thinks sore throat is from GERD.  She has an appointment with ENT Monday afternoon.  She still wanted to come in and see you Monday.  Ok for in off visit

## 2019-11-26 NOTE — Telephone Encounter (Signed)
Patient contacted the office to schedule an appointment for her throat. Patient states her throat does feel a little sore, but she states she has had no COVID exposures, and is having no COVID related symptoms.Patient states this has been going on for over 1 month, and that this does not feel like a typical sore throat. She states that it feels like there is a lump in her throat when she swallows, and that it feels as if she has been yelling for a long period of time, and the muscles in her throat are sore, and almost feel like they spasm from time to time.  I did schedule this patient for an in office visit on Monday, because her COVID screen is negative, and I feel like this does need to be evaluated, but patient understands if this needs to be changed to virtual due to COVID precautions.   Debbie, what do you think? Should this patient be seen in person, or should it be a virtual visit because of our COVID protocol?

## 2019-11-26 NOTE — Telephone Encounter (Signed)
See telephone note also

## 2019-11-29 ENCOUNTER — Ambulatory Visit: Payer: BC Managed Care – PPO | Admitting: Family Medicine

## 2019-11-29 ENCOUNTER — Other Ambulatory Visit: Payer: Self-pay

## 2019-11-29 ENCOUNTER — Encounter: Payer: Self-pay | Admitting: Family Medicine

## 2019-11-29 VITALS — BP 116/70 | HR 79 | Temp 98.4°F | Ht 64.0 in | Wt 142.1 lb

## 2019-11-29 DIAGNOSIS — R1314 Dysphagia, pharyngoesophageal phase: Secondary | ICD-10-CM | POA: Diagnosis not present

## 2019-11-29 DIAGNOSIS — J029 Acute pharyngitis, unspecified: Secondary | ICD-10-CM

## 2019-11-29 DIAGNOSIS — K219 Gastro-esophageal reflux disease without esophagitis: Secondary | ICD-10-CM | POA: Diagnosis not present

## 2019-11-29 DIAGNOSIS — R221 Localized swelling, mass and lump, neck: Secondary | ICD-10-CM

## 2019-11-29 DIAGNOSIS — F458 Other somatoform disorders: Secondary | ICD-10-CM | POA: Diagnosis not present

## 2019-11-29 NOTE — Telephone Encounter (Signed)
Left message asking pt to call office.  Please let her know debbie said ok to come in office and do covid screening

## 2019-11-29 NOTE — Progress Notes (Signed)
Subjective:    Patient ID: Colleen Taylor, female    DOB: [birth year], 39 y.o.   MRN: [id number]  HPI Chief Complaint  Patient presents with  . Throat Muscle Ache    x 1 mo. Pt does have reflux.    Had televisit 08/09/19. All symptoms resolved.   Has had sore throat for about 1 month. Feels like a "hollow ache," feels like food is not going down all the time. Worse with certain foods.  Throat pain worse as day goes on. Worse with more talking. PND/ nasal drainage improved. Has appointment with ENT.  Food does not get stuck.  Has tried Tums.  Regurgitation- worsening symptoms, more constant.  Changed diet, no improvement.  No diarrhea/ constipation, blood or mucus, no abdominal pain.  Occasional ear pain.  No headaches.   Sister recently diagnosed with uterine cancer.   Review of Systems Per HPI    Objective:   Physical Exam Vitals reviewed.  Constitutional:      General: She is not in acute distress.    Appearance: Normal appearance. She is normal weight. She is not ill-appearing, toxic-appearing or diaphoretic.  HENT:     Head: Normocephalic and atraumatic.     Right Ear: Tympanic membrane, ear canal and external ear normal.     Left Ear: Tympanic membrane, ear canal and external ear normal.     Nose: Nose normal.     Mouth/Throat:     Mouth: Mucous membranes are moist.     Pharynx: Uvula midline. Posterior oropharyngeal erythema (mild) present. No pharyngeal swelling, oropharyngeal exudate or uvula swelling.     Tonsils: No tonsillar exudate.  Eyes:     Conjunctiva/sclera: Conjunctivae normal.  Neck:   Cardiovascular:     Rate and Rhythm: Normal rate and regular rhythm.     Heart sounds: Normal heart sounds.  Pulmonary:     Effort: Pulmonary effort is normal.     Breath sounds: Normal breath sounds.  Musculoskeletal:     Cervical back: Normal range of motion and neck supple.  Skin:    General: Skin is warm and dry.  Neurological:     Mental Status: She is  alert and oriented to person, place, and time.  Psychiatric:        Mood and Affect: Mood normal.        Behavior: Behavior normal.        Thought Content: Thought content normal.        Judgment: Judgment normal.       BP 116/70 (BP Location: Left Arm, Patient Position: Sitting, Cuff Size: Normal)   Pulse 79   Temp 98.4 F (36.9 C) (Temporal)   Ht 5\' 4"  (1.626 m)   Wt 142 lb 1.9 oz (64.5 kg)   SpO2 97%   BMI 24.39 kg/m  Wt Readings from Last 3 Encounters:  11/29/19 142 lb 1.9 oz (64.5 kg)  08/09/19 138 lb (62.6 kg)  05/17/19 141 lb 12 oz (64.3 kg)       Assessment & Plan:  1. Sore throat - has appointment with ENT later today  2. Nodule of neck - unclear if reactive lymph node or on side of thyroid gland, ultrasound if does not resolve  3. Gastroesophageal reflux disease, unspecified whether esophagitis present - avoid triggers - ? Trial of PPI. Will await ENT evaluation and recommendations  This visit occurred during the SARS-CoV-2 public health emergency.  Safety protocols were in place, including screening questions prior to  the visit, additional usage of staff PPE, and extensive cleaning of exam room while observing appropriate contact time as indicated for disinfecting solutions.      Olean Ree, FNP-BC  [facility], [facility]  12/01/2019 5:47 AM

## 2019-11-29 NOTE — Telephone Encounter (Signed)
Patient has been notified. COVID screen is negative.

## 2019-11-29 NOTE — Patient Instructions (Addendum)
I am thinking 1-3 months PPI ? Gastroenterology referral ? Small nodule left side of thyroid

## 2019-11-29 NOTE — Telephone Encounter (Signed)
Patient seen in office 11/29/19.

## 2019-11-30 ENCOUNTER — Other Ambulatory Visit: Payer: Self-pay | Admitting: Otolaryngology

## 2019-11-30 DIAGNOSIS — R1310 Dysphagia, unspecified: Secondary | ICD-10-CM

## 2019-12-07 ENCOUNTER — Other Ambulatory Visit: Payer: Self-pay

## 2019-12-07 ENCOUNTER — Ambulatory Visit
Admission: RE | Admit: 2019-12-07 | Discharge: 2019-12-07 | Disposition: A | Discharge status: Home / Self Care | Payer: BC Managed Care – PPO | Source: Ambulatory Visit | Attending: Otolaryngology | Admitting: Otolaryngology

## 2019-12-07 DIAGNOSIS — R1310 Dysphagia, unspecified: Secondary | ICD-10-CM | POA: Diagnosis not present

## 2019-12-07 DIAGNOSIS — R0989 Other specified symptoms and signs involving the circulatory and respiratory systems: Secondary | ICD-10-CM | POA: Diagnosis not present

## 2019-12-13 ENCOUNTER — Encounter: Payer: Self-pay | Admitting: *Deleted

## 2019-12-22 ENCOUNTER — Encounter: Payer: Self-pay | Admitting: Gastroenterology

## 2019-12-23 ENCOUNTER — Ambulatory Visit (INDEPENDENT_AMBULATORY_CARE_PROVIDER_SITE_OTHER): Payer: BC Managed Care – PPO | Admitting: Gastroenterology

## 2019-12-23 DIAGNOSIS — Z5329 Procedure and treatment not carried out because of patient's decision for other reasons: Secondary | ICD-10-CM

## 2019-12-29 ENCOUNTER — Other Ambulatory Visit: Payer: Self-pay

## 2019-12-29 ENCOUNTER — Ambulatory Visit (INDEPENDENT_AMBULATORY_CARE_PROVIDER_SITE_OTHER): Payer: BC Managed Care – PPO | Admitting: Gastroenterology

## 2019-12-29 ENCOUNTER — Encounter: Payer: Self-pay | Admitting: Gastroenterology

## 2019-12-29 DIAGNOSIS — R1310 Dysphagia, unspecified: Secondary | ICD-10-CM | POA: Diagnosis not present

## 2019-12-29 NOTE — Progress Notes (Signed)
Colleen Taylor [address]  [address]  [place], [place] [address]  Main: [phone]  Fax: [phone]   Gastroenterology Consultation  Referring Provider:     Vernie Murders, MD Primary Care Physician:  Emi Belfast, FNP Reason for Consultation:    Dysphagia        HPI:   Virtual Visit via Video Note  I connected with patient on 12/29/19 at 10:45 AM EDT by video ([email or web address]) and verified that I am speaking with the correct person using two identifiers.   I discussed the limitations, risks, security and privacy concerns of performing an evaluation and management service by video and the availability of in person appointments. I also discussed with the patient that there may be a patient responsible charge related to this service. The patient expressed understanding and agreed to proceed.  Location of the patient: Home Location of provider: Home Participating persons: Patient and provider only (Nursing staff checked in patient via phone but were not physically involved in the video interaction - see their notes)   History of Present Illness: Chief complaint: Dysphagia  Colleen Taylor is a 39 y.o. y/o female referred for consultation & management  by Dr. Leone Payor, Binnie Rail, FNP.  Patient reports burning sensation in chest over the last few months.  Also reports dysphagia specifically to solids containing cabbage, and certain pills.  No episodes of food impaction.  States when dysphagia occurs she has to stop what she is eating, drink lots of water and it eventually passes.  No weight loss.  No nausea or vomiting.  Is taking Prilosec which has helped the burning sensation in chest but the dysphagia and globus sensation continue.  No prior EGD.  No family history of GI malignancy.  Does report chronic history since childhood of regurgitating food that she then chews.  Was evaluated by ENT and underwent a esophagram as well which was unrevealing  Past Medical History:    Diagnosis Date  . Anxiety   . Migraines   . Umbilical hernia   . UTI (urinary tract infection)     Past Surgical History:  Procedure Laterality Date  . ABDOMINAL SURGERY    . BREAST REDUCTION SURGERY  2002  . CESAREAN SECTION  2010, 2014  . HAND SURGERY    . SHOULDER SURGERY    . TUBAL LIGATION      Prior to Admission medications   Medication Sig Start Date End Date Taking? Authorizing Provider  Multiple Vitamin (MULTIVITAMIN) capsule Take 1 capsule by mouth daily.   Yes [provider]  omeprazole (PRILOSEC) 40 MG capsule Take 40 mg by mouth daily. 12/03/19  Yes [provider]  SUMAtriptan (IMITREX) 50 MG tablet Take 1 tablet (50 mg total) by mouth every 2 (two) hours as needed for migraine. May repeat in 2 hours if headache persists or recurs. 05/17/19  Yes Emi Belfast, FNP  valACYclovir (VALTREX) 1000 MG tablet Take 2 tablets (2,000 mg total) by mouth 2 (two) times daily. For one day. 05/17/19  Yes Emi Belfast, FNP    Family History  Problem Relation Age of Onset  . Hypertension Mother   . Hypertension Father   . Hearing loss Father   . Hypertension Sister   . Depression Sister   . Supraventricular tachycardia Sister   . Miscarriages / Stillbirths Sister   . Supraventricular tachycardia Maternal Aunt   . Supraventricular tachycardia Maternal Uncle   . Supraventricular tachycardia Paternal Aunt   .  Supraventricular tachycardia Paternal Uncle   . Cancer Paternal Grandfather   . Hearing loss Paternal Grandfather      Social History   Tobacco Use  . Smoking status: Never Smoker  . Smokeless tobacco: Never Used  Substance Use Topics  . Alcohol use: Yes    Alcohol/week: 0.0 standard drinks    Comment: occasionally  . Drug use: No    Allergies as of 12/29/2019 - Review Complete 12/29/2019  Allergen Reaction Noted  . Iodine Nausea And Vomiting and Swelling 12/29/2019    Review of Systems:    All systems reviewed and negative except  where noted in HPI.   Observations/Objective:  Labs: CBC    Component Value Date/Time   WBC 3.7 (L) 12/31/2017 1027   RBC 4.48 12/31/2017 1027   HGB 13.9 12/31/2017 1027   HCT 40.0 12/31/2017 1027   PLT 219.0 12/31/2017 1027   MCV 89.5 12/31/2017 1027   MCHC 34.8 12/31/2017 1027   RDW 12.6 12/31/2017 1027   LYMPHSABS 1.3 12/31/2017 1027   MONOABS 0.3 12/31/2017 1027   EOSABS 0.0 12/31/2017 1027   BASOSABS 0.0 12/31/2017 1027   CMP     Component Value Date/Time   NA 140 12/31/2017 1027   K 4.3 12/31/2017 1027   CL 106 12/31/2017 1027   CO2 28 12/31/2017 1027   GLUCOSE 80 12/31/2017 1027   BUN 17 12/31/2017 1027   CREATININE 0.81 12/31/2017 1027   CALCIUM 9.5 12/31/2017 1027   PROT 7.3 12/31/2017 1027   ALBUMIN 4.1 12/31/2017 1027   AST 20 12/31/2017 1027   ALT 13 12/31/2017 1027   ALKPHOS 43 12/31/2017 1027   BILITOT 0.3 12/31/2017 1027    Imaging Studies: DG ESOPHAGUS W DOUBLE CM (HD)  Result Date: 12/07/2019 CLINICAL DATA:  Globus. EXAM: ESOPHOGRAM / BARIUM SWALLOW / BARIUM TABLET STUDY TECHNIQUE: Combined double contrast and single contrast examination performed using effervescent crystals, thick barium liquid, and thin barium liquid. The patient was observed with fluoroscopy swallowing a 13 mm barium sulphate tablet. FLUOROSCOPY TIME:  Fluoroscopy Time:  1 minutes 12 seconds Number of Acquired Spot Images: 0 COMPARISON:  None. FINDINGS: Pharynx is normal in appearance and function. The esophagus is normal in appearance and function with no diverticuli, masses, strictures, or obvious mucosal abnormalities. The barium tablet passed normally at the end of the study. A single limited view of the stomach is normal in appearance. IMPRESSION: No cause for the patient's symptoms identified. The pharynx, esophagus, and stomach are normal in appearance. Electronically Signed   By: Gerome Sam III M.D   On: 12/07/2019 11:39    Assessment and Plan:   Colleen Taylor is a 39  y.o. y/o female has been referred for dysphagia  Assessment and Plan: EGD indicated for further evaluation of dysphagia with biopsies for EOE as well if indicated  Rumination syndrome is a possibility given chronic symptoms of regurgitation and swallowing food.  However, due to dysphagia EGD would be next step  Omeprazole has helped her heartburn symptoms  (Risks of PPI use were discussed with patient including bone loss, C. Diff diarrhea, pneumonia, infections, CKD, electrolyte abnormalities.  Pt. Verbalizes understanding and chooses to continue the medication.)   Follow Up Instructions:   I discussed the assessment and treatment plan with the patient. The patient was provided an opportunity to ask questions and all were answered. The patient agreed with the plan and demonstrated an understanding of the instructions.   The patient was advised to  call back or seek an in-person evaluation if the symptoms worsen or if the condition fails to improve as anticipated.  I provided 15 minutes of face-to-face time via video software during this encounter.  Additional time was spent in reviewing patient's chart, placing orders etc.   Virgel Manifold, MD  Speech recognition software was used to dictate the above note.

## 2020-01-28 ENCOUNTER — Other Ambulatory Visit: Payer: Self-pay

## 2020-01-28 ENCOUNTER — Other Ambulatory Visit
Admission: RE | Admit: 2020-01-28 | Discharge: 2020-01-28 | Disposition: A | Discharge status: Home / Self Care | Payer: BC Managed Care – PPO | Source: Ambulatory Visit | Attending: Gastroenterology | Admitting: Gastroenterology

## 2020-01-28 DIAGNOSIS — Z20822 Contact with and (suspected) exposure to covid-19: Secondary | ICD-10-CM | POA: Diagnosis not present

## 2020-01-28 DIAGNOSIS — Z01812 Encounter for preprocedural laboratory examination: Secondary | ICD-10-CM | POA: Insufficient documentation

## 2020-01-28 LAB — SARS CORONAVIRUS 2 (TAT 6-24 HRS): SARS Coronavirus 2: NEGATIVE

## 2020-02-01 ENCOUNTER — Ambulatory Visit: Payer: BC Managed Care – PPO | Admitting: Anesthesiology

## 2020-02-01 ENCOUNTER — Encounter
Admission: RE | Disposition: A | Discharge status: Home / Self Care | Payer: Self-pay | Source: Home / Self Care | Attending: Gastroenterology

## 2020-02-01 ENCOUNTER — Other Ambulatory Visit: Payer: Self-pay

## 2020-02-01 ENCOUNTER — Encounter: Payer: Self-pay | Admitting: Gastroenterology

## 2020-02-01 ENCOUNTER — Ambulatory Visit
Admission: RE | Admit: 2020-02-01 | Discharge: 2020-02-01 | Disposition: A | Discharge status: Home / Self Care | Payer: BC Managed Care – PPO | Attending: Gastroenterology | Admitting: Gastroenterology

## 2020-02-01 DIAGNOSIS — Z888 Allergy status to other drugs, medicaments and biological substances status: Secondary | ICD-10-CM | POA: Insufficient documentation

## 2020-02-01 DIAGNOSIS — Z79899 Other long term (current) drug therapy: Secondary | ICD-10-CM | POA: Insufficient documentation

## 2020-02-01 DIAGNOSIS — R1310 Dysphagia, unspecified: Secondary | ICD-10-CM

## 2020-02-01 DIAGNOSIS — G43909 Migraine, unspecified, not intractable, without status migrainosus: Secondary | ICD-10-CM | POA: Diagnosis not present

## 2020-02-01 HISTORY — PX: ESOPHAGOGASTRODUODENOSCOPY (EGD) WITH PROPOFOL: SHX5813

## 2020-02-01 LAB — POCT PREGNANCY, URINE: Preg Test, Ur: NEGATIVE

## 2020-02-01 SURGERY — ESOPHAGOGASTRODUODENOSCOPY (EGD) WITH PROPOFOL
Anesthesia: General

## 2020-02-01 MED ORDER — ONDANSETRON HCL 4 MG/2ML IJ SOLN
INTRAMUSCULAR | Status: AC
  Filled 2020-02-01: qty 2

## 2020-02-01 MED ORDER — MIDAZOLAM HCL 2 MG/2ML IJ SOLN
INTRAMUSCULAR | Status: AC
  Filled 2020-02-01: qty 2

## 2020-02-01 MED ORDER — SODIUM CHLORIDE 0.9 % IV SOLN: INTRAVENOUS | Status: DC

## 2020-02-01 MED ORDER — ESMOLOL HCL 100 MG/10ML IV SOLN
INTRAVENOUS | Status: AC
  Filled 2020-02-01: qty 10

## 2020-02-01 MED ORDER — LIDOCAINE HCL (CARDIAC) PF 100 MG/5ML IV SOSY
PREFILLED_SYRINGE | INTRAVENOUS | Status: DC | PRN
  Administered 2020-02-01: 100 mg via INTRAVENOUS

## 2020-02-01 MED ORDER — MIDAZOLAM HCL 2 MG/2ML IJ SOLN
INTRAMUSCULAR | Status: DC | PRN
  Administered 2020-02-01: 2 mg via INTRAVENOUS

## 2020-02-01 MED ORDER — PROPOFOL 500 MG/50ML IV EMUL
INTRAVENOUS | Status: DC | PRN
  Administered 2020-02-01: 165 ug/kg/min via INTRAVENOUS

## 2020-02-01 MED ORDER — ONDANSETRON HCL 4 MG/2ML IJ SOLN
INTRAMUSCULAR | Status: DC | PRN
  Administered 2020-02-01: 4 mg via INTRAVENOUS

## 2020-02-01 MED ORDER — PROPOFOL 10 MG/ML IV BOLUS
INTRAVENOUS | Status: DC | PRN
  Administered 2020-02-01: 50 mg via INTRAVENOUS

## 2020-02-01 MED ORDER — GLYCOPYRROLATE 0.2 MG/ML IJ SOLN
INTRAMUSCULAR | Status: DC | PRN
  Administered 2020-02-01: .2 mg via INTRAVENOUS

## 2020-02-01 NOTE — Anesthesia Postprocedure Evaluation (Signed)
Anesthesia Post Note  Patient: NORMALEE SISTARE  Procedure(s) Performed: ESOPHAGOGASTRODUODENOSCOPY (EGD) WITH PROPOFOL (N/A )  Patient location during evaluation: Endoscopy Anesthesia Type: General Level of consciousness: awake and alert and oriented Pain management: pain level controlled Vital Signs Assessment: post-procedure vital signs reviewed and stable Respiratory status: spontaneous breathing Cardiovascular status: blood pressure returned to baseline Anesthetic complications: no     Last Vitals:  Vitals:   02/01/20 1248 02/01/20 1258  BP: (!) 110/99 120/83  Pulse:    Resp:    Temp:    SpO2: 100%     Last Pain:  Vitals:   02/01/20 1248  TempSrc:   PainSc: 0-No pain                 Jordy Verba

## 2020-02-01 NOTE — Transfer of Care (Signed)
Immediate Anesthesia Transfer of Care Note  Patient: Colleen Taylor  Procedure(s) Performed: ESOPHAGOGASTRODUODENOSCOPY (EGD) WITH PROPOFOL (N/A )  Patient Location: PACU  Anesthesia Type:General  Level of Consciousness: sedated  Airway & Oxygen Therapy: Patient Spontanous Breathing and Patient connected to nasal cannula oxygen  Post-op Assessment: Report given to RN and Post -op Vital signs reviewed and stable  Post vital signs: Reviewed and stable  Last Vitals:  Vitals Value Taken Time  BP 113/79 02/01/20 1219  Temp    Pulse 69 02/01/20 1219  Resp 15 02/01/20 1219  SpO2 100 % 02/01/20 1219  Vitals shown include unvalidated device data.  Last Pain:  Vitals:   02/01/20 1103  TempSrc: Temporal  PainSc: 0-No pain         Complications: No apparent anesthesia complications

## 2020-02-01 NOTE — Op Note (Signed)
[facility] Gastroenterology Patient Name: Colleen Taylor Procedure Date: 02/01/2020 11:57 AM MRN: [id number] Account #: [id number] Date of Birth: 1980/12/19 Admit Type: Outpatient Age: 39 Room: [facility] ENDO ROOM 2 Gender: Female Note Status: Finalized Procedure:             Upper GI endoscopy Indications:           Dysphagia Providers:             Marshon Bangs B. Bonna Gains MD, MD Medicines:             Monitored Anesthesia Care Complications:         No immediate complications. Procedure:             Pre-Anesthesia Assessment:                        - Prior to the procedure, a History and Physical was                         performed, and patient medications, allergies and                         sensitivities were reviewed. The patient's tolerance                         of previous anesthesia was reviewed.                        - The risks and benefits of the procedure and the                         sedation options and risks were discussed with the                         patient. All questions were answered and informed                         consent was obtained.                        - Patient identification and proposed procedure were                         verified prior to the procedure by the physician, the                         nurse, the anesthesiologist, the anesthetist and the                         technician. The procedure was verified in the                         procedure room.                        - ASA Grade Assessment: II - A patient with mild                         systemic disease.  After obtaining informed consent, the endoscope was                         passed under direct vision. Throughout the procedure,                         the patient's blood pressure, pulse, and oxygen                         saturations were monitored continuously. The Endoscope                         was introduced through the  mouth, and advanced to the                         second part of duodenum. The upper GI endoscopy was                         accomplished with ease. The patient tolerated the                         procedure well. Findings:      The examined esophagus was normal. Biopsies were obtained from the       proximal and distal esophagus with cold forceps for histology of       suspected eosinophilic esophagitis.      The entire examined stomach was normal.      The duodenal bulb, second portion of the duodenum and examined duodenum       were normal. Impression:            - Normal esophagus. Biopsied.                        - Normal stomach.                        - Normal duodenal bulb, second portion of the duodenum                         and examined duodenum. Recommendation:        - Await pathology results.                        - Discharge patient to home (with escort).                        - Advance diet as tolerated.                        - Continue present medications.                        - Patient has a contact number available for                         emergencies. The signs and symptoms of potential                         delayed complications were discussed with the patient.  Return to normal activities tomorrow. Written                         discharge instructions were provided to the patient.                        - Discharge patient to home (with escort).                        - The findings and recommendations were discussed with                         the patient.                        - The findings and recommendations were discussed with                         the patient's family. Procedure Code(s):     --- Professional ---                        [phone], Esophagogastroduodenoscopy, flexible,                         transoral; with biopsy, single or multiple Diagnosis Code(s):     --- Professional ---                         R13.10, Dysphagia, unspecified CPT copyright 2019 American Medical Association. All rights reserved. The codes documented in this report are preliminary and upon coder review may  be revised to meet current compliance requirements.  Melodie Bouillon, MD Michel Bickers B. Maximino Greenland MD, MD 02/01/2020 12:22:51 PM This report has been signed electronically. Number of Addenda: 0 Note Initiated On: 02/01/2020 11:57 AM Estimated Blood Loss:  Estimated blood loss: none.      [facility]

## 2020-02-01 NOTE — Anesthesia Preprocedure Evaluation (Signed)
Anesthesia Evaluation  Patient identified by MRN, date of birth, ID band Patient awake    Reviewed: Allergy & Precautions, NPO status , Patient's Chart, lab work & pertinent test results  History of Anesthesia Complications (+) PONVNegative for: history of anesthetic complications  Airway Mallampati: II       Dental   Pulmonary neg sleep apnea, neg COPD, Not current smoker,           Cardiovascular (-) hypertension(-) Past MI and (-) CHF (-) dysrhythmias (-) Valvular Problems/Murmurs     Neuro/Psych neg Seizures Anxiety    GI/Hepatic Neg liver ROS, GERD  Medicated,  Endo/Other  neg diabetes  Renal/GU negative Renal ROS     Musculoskeletal   Abdominal   Peds  Hematology   Anesthesia Other Findings   Reproductive/Obstetrics                             Anesthesia Physical Anesthesia Plan  ASA: II  Anesthesia Plan: General   Post-op Pain Management:    Induction: Intravenous  PONV Risk Score and Plan: 4 or greater and Propofol infusion, TIVA, Treatment may vary due to age or medical condition and Ondansetron  Airway Management Planned: Nasal Cannula  Additional Equipment:   Intra-op Plan:   Post-operative Plan:   Informed Consent: I have reviewed the patients History and Physical, chart, labs and discussed the procedure including the risks, benefits and alternatives for the proposed anesthesia with the patient or authorized representative who has indicated his/her understanding and acceptance.       Plan Discussed with:   Anesthesia Plan Comments:         Anesthesia Quick Evaluation

## 2020-02-01 NOTE — H&P (Signed)
Vonda Antigua, MD [address] [place], [place], [place], [address] [address], [place], [place], [place], [address] Phone: [phone]  Fax: [phone]  Primary Care Physician:  Elby Beck, FNP   Pre-Procedure History & Physical: HPI:  Colleen Taylor is a 39 y.o. female is here for an EGD.   Past Medical History:  Diagnosis Date  . Anxiety   . Migraines   . Umbilical hernia   . UTI (urinary tract infection)     Past Surgical History:  Procedure Laterality Date  . ABDOMINAL SURGERY    . BREAST REDUCTION SURGERY  2002  . CESAREAN SECTION  2010, 2014  . HAND SURGERY    . SHOULDER SURGERY    . TUBAL LIGATION      Prior to Admission medications   Medication Sig Start Date End Date Taking? Authorizing Provider  Multiple Vitamin (MULTIVITAMIN) capsule Take 1 capsule by mouth daily.   Yes [provider]  omeprazole (PRILOSEC) 40 MG capsule Take 40 mg by mouth daily. 12/03/19  Yes [provider]  SUMAtriptan (IMITREX) 50 MG tablet Take 1 tablet (50 mg total) by mouth every 2 (two) hours as needed for migraine. May repeat in 2 hours if headache persists or recurs. 05/17/19  Yes Elby Beck, FNP  valACYclovir (VALTREX) 1000 MG tablet Take 2 tablets (2,000 mg total) by mouth 2 (two) times daily. For one day. 05/17/19  Yes Elby Beck, FNP    Allergies as of 12/29/2019 - Review Complete 12/29/2019  Allergen Reaction Noted  . Iodine Nausea And Vomiting and Swelling 12/29/2019    Family History  Problem Relation Age of Onset  . Hypertension Mother   . Hypertension Father   . Hearing loss Father   . Hypertension Sister   . Depression Sister   . Supraventricular tachycardia Sister   . Miscarriages / Stillbirths Sister   . Supraventricular tachycardia Maternal Aunt   . Supraventricular tachycardia Maternal Uncle   . Supraventricular tachycardia Paternal Aunt   . Supraventricular tachycardia Paternal Uncle   . Cancer Paternal  Grandfather   . Hearing loss Paternal Grandfather     Social History   Socioeconomic History  . Marital status: Married    Spouse name: Not on file  . Number of children: Not on file  . Years of education: Not on file  . Highest education level: Not on file  Occupational History  . Not on file  Tobacco Use  . Smoking status: Never Smoker  . Smokeless tobacco: Never Used  Substance and Sexual Activity  . Alcohol use: Yes    Alcohol/week: 0.0 standard drinks    Comment: occasionally  . Drug use: No  . Sexual activity: Yes    Birth control/protection: Surgical    Comment: Tubal  Other Topics Concern  . Not on file  Social History Narrative  . Not on file   Social Determinants of Health   Financial Resource Strain:   . Difficulty of Paying Living Expenses:   Food Insecurity:   . Worried About Charity fundraiser in the Last Year:   . Arboriculturist in the Last Year:   Transportation Needs:   . Film/video editor (Medical):   Marland Kitchen Lack of Transportation (Non-Medical):   Physical Activity:   . Days of Exercise per Week:   . Minutes of Exercise per Session:   Stress:   . Feeling of Stress :   Social Connections:   . Frequency  of Communication with Friends and Family:   . Frequency of Social Gatherings with Friends and Family:   . Attends Religious Services:   . Active Member of Clubs or Organizations:   . Attends Banker Meetings:   Marland Kitchen Marital Status:   Intimate Partner Violence:   . Fear of Current or Ex-Partner:   . Emotionally Abused:   Marland Kitchen Physically Abused:   . Sexually Abused:     Review of Systems: See HPI, otherwise negative ROS  Physical Exam: BP (!) 125/94   Pulse 81   Temp (!) 97.5 F (36.4 C) (Temporal)   Resp 18   Ht 5\' 5"  (1.651 m)   Wt 62.1 kg   SpO2 100%   BMI 22.80 kg/m  General:   Alert,  pleasant and cooperative in NAD Head:  Normocephalic and atraumatic. Neck:  Supple; no masses or thyromegaly. Lungs:  Clear  throughout to auscultation, normal respiratory effort.    Heart:  +S1, +S2, Regular rate and rhythm, No edema. Abdomen:  Soft, nontender and nondistended. Normal bowel sounds, without guarding, and without rebound.   Neurologic:  Alert and  oriented x4;  grossly normal neurologically.  Impression/Plan: Colleen Taylor is here for an EGD for dysphagia  Risks, benefits, limitations, and alternatives regarding the procedure have been reviewed with the patient.  Questions have been answered.  All parties agreeable.   [name], MD  02/01/2020, 11:30 AM

## 2020-02-02 ENCOUNTER — Encounter: Payer: Self-pay | Admitting: *Deleted

## 2020-02-02 LAB — SURGICAL PATHOLOGY

## 2020-02-14 ENCOUNTER — Encounter: Payer: Self-pay | Admitting: Gastroenterology

## 2020-02-14 ENCOUNTER — Ambulatory Visit: Payer: BC Managed Care – PPO | Admitting: Gastroenterology

## 2020-02-14 ENCOUNTER — Other Ambulatory Visit: Payer: Self-pay

## 2020-02-14 VITALS — BP 125/68 | HR 78 | Temp 98.5°F | Ht 64.0 in | Wt 141.8 lb

## 2020-02-14 DIAGNOSIS — R1310 Dysphagia, unspecified: Secondary | ICD-10-CM | POA: Diagnosis not present

## 2020-02-14 NOTE — Progress Notes (Signed)
Colleen Antigua, MD [address]  [place]  [place], [place] [address]  Main: [phone]  Fax: [phone]   Primary Care Physician: Elby Beck, FNP   Chief complaint: Follow-up for dysphagia  HPI: Colleen Taylor is a 39 y.o. female here for follow-up of dysphagia with upper endoscopy including biopsies being normal. Pt states symptoms are much improved with dietary changes that she has made  The patient denies abdominal or flank pain, anorexia, nausea or vomiting, dysphagia, change in bowel habits or black or bloody stools or weight loss.   Current Outpatient Medications  Medication Sig Dispense Refill  . Multiple Vitamin (MULTIVITAMIN) capsule Take 1 capsule by mouth daily.    Marland Kitchen omeprazole (PRILOSEC) 40 MG capsule Take 40 mg by mouth daily.    . SUMAtriptan (IMITREX) 50 MG tablet Take 1 tablet (50 mg total) by mouth every 2 (two) hours as needed for migraine. May repeat in 2 hours if headache persists or recurs. 9 tablet 2  . valACYclovir (VALTREX) 1000 MG tablet Take 2 tablets (2,000 mg total) by mouth 2 (two) times daily. For one day. 20 tablet 1   No current facility-administered medications for this visit.    Allergies as of 02/14/2020 - Review Complete 02/01/2020  Allergen Reaction Noted  . Iodine Nausea And Vomiting and Swelling 12/29/2019  . Shrimp [shellfish allergy]  02/01/2020    ROS:  General: Negative for anorexia, weight loss, fever, chills, fatigue, weakness. ENT: Negative for hoarseness, difficulty swallowing , nasal congestion. CV: Negative for chest pain, angina, palpitations, dyspnea on exertion, peripheral edema.  Respiratory: Negative for dyspnea at rest, dyspnea on exertion, cough, sputum, wheezing.  GI: See history of present illness. GU:  Negative for dysuria, hematuria, urinary incontinence, urinary frequency, nocturnal urination.  Endo: Negative for unusual weight change.    Physical Examination:   There were no vitals  taken for this visit.  General: Well-nourished, well-developed in no acute distress.  Eyes: No icterus. Conjunctivae pink. Mouth: Oropharyngeal mucosa moist and pink , no lesions erythema or exudate. Neck: Supple, Trachea midline Abdomen: Bowel sounds are normal, nontender, nondistended, no hepatosplenomegaly or masses, no abdominal bruits or hernia , no rebound or guarding.   Extremities: No lower extremity edema. No clubbing or deformities. Neuro: Alert and oriented x 3.  Grossly intact. Skin: Warm and dry, no jaundice.   Psych: Alert and cooperative, normal mood and affect.   Labs: CMP     Component Value Date/Time   NA 140 12/31/2017 1027   K 4.3 12/31/2017 1027   CL 106 12/31/2017 1027   CO2 28 12/31/2017 1027   GLUCOSE 80 12/31/2017 1027   BUN 17 12/31/2017 1027   CREATININE 0.81 12/31/2017 1027   CALCIUM 9.5 12/31/2017 1027   PROT 7.3 12/31/2017 1027   ALBUMIN 4.1 12/31/2017 1027   AST 20 12/31/2017 1027   ALT 13 12/31/2017 1027   ALKPHOS 43 12/31/2017 1027   BILITOT 0.3 12/31/2017 1027   Lab Results  Component Value Date   WBC 3.7 (L) 12/31/2017   HGB 13.9 12/31/2017   HCT 40.0 12/31/2017   MCV 89.5 12/31/2017   PLT 219.0 12/31/2017    Imaging Studies: No results found.  Assessment and Plan:   EMALI HEYWARD is a 39 y.o. y/o female here for follow up of dysphagia with normal EGD  Symptoms resolved at this time  Workup has been benign  Pt is reassured after benign endoscopic findings and would like to  follow up as needed      Dr Melodie Bouillon

## 2020-06-01 ENCOUNTER — Telehealth: Payer: Self-pay | Admitting: Family Medicine

## 2020-06-01 DIAGNOSIS — G43009 Migraine without aura, not intractable, without status migrainosus: Secondary | ICD-10-CM

## 2020-06-06 NOTE — Telephone Encounter (Signed)
Please schedule CPE with Colleen Taylor or at least follow up migraines.  Needs appointment to continue getting refills on her medication.

## 2020-06-15 NOTE — Telephone Encounter (Signed)
Left message asking pt to call office  °

## 2020-06-21 ENCOUNTER — Encounter: Payer: Self-pay | Admitting: Family Medicine

## 2020-06-21 NOTE — Telephone Encounter (Signed)
Left message asking pt to call office Mailed letter 

## 2020-09-30 IMAGING — RF DG ESOPHAGUS
9 series · 14 of 24 positions shown · non-contrast
Comparison: None.

CLINICAL DATA: Globus.

EXAM:
ESOPHOGRAM / BARIUM SWALLOW / BARIUM TABLET STUDY
TECHNIQUE: Combined double contrast and single contrast examination performed
using effervescent crystals, thick barium liquid, and thin barium
liquid. The patient was observed with fluoroscopy swallowing a 13 mm
barium sulphate tablet.
FLUOROSCOPY TIME:  Fluoroscopy Time:  1 minutes 12 seconds
Number of Acquired Spot Images: 0

[Series 1: cp_standard · 0.25mm/px · 2 of 23 frames shown (1 of 9)]
[frame 4/23]
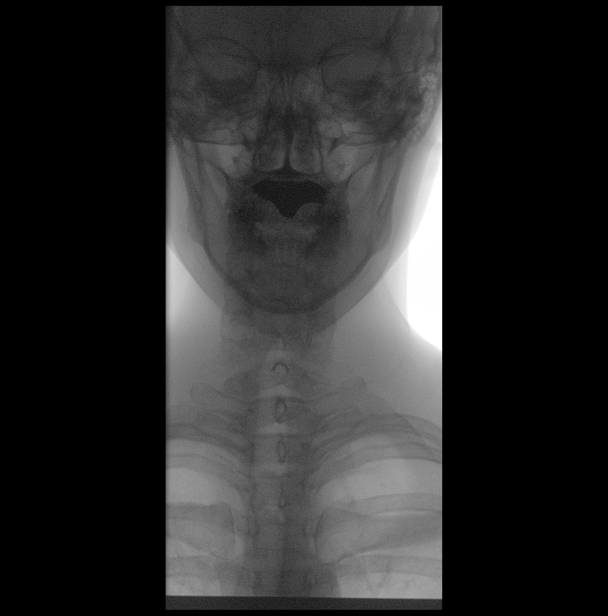
[frame 20/23]
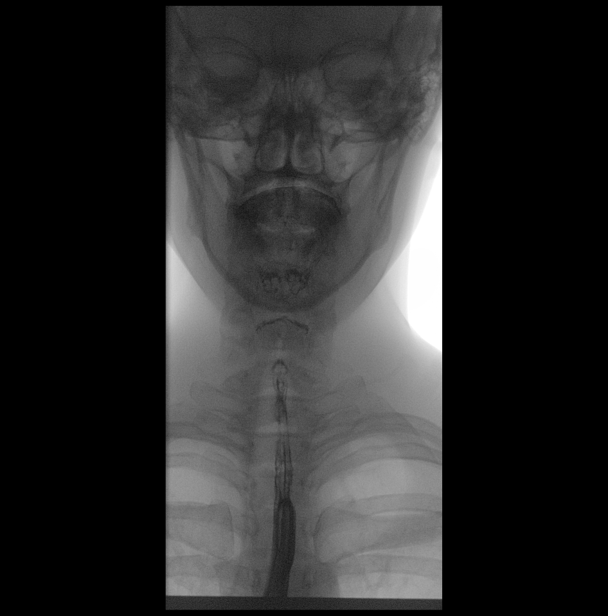

[Series 2: cp_standard · 0.25mm/px · 1 of 19 frames shown (2 of 9)]
[frame 14/19]
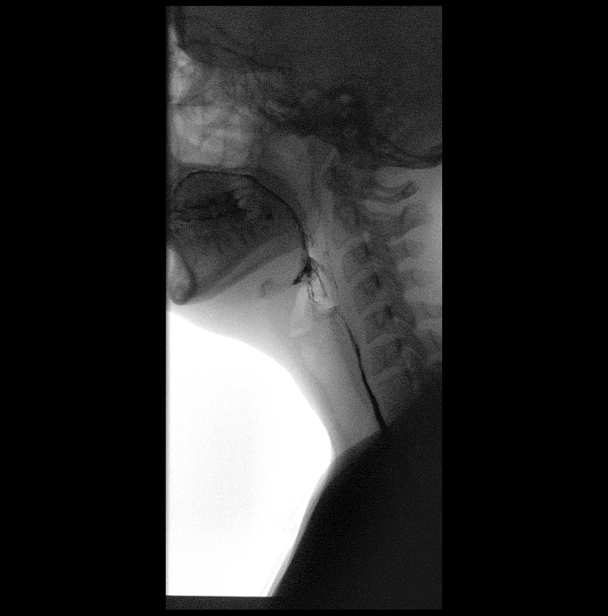

[Series 3: cp_standard · 0.25mm/px · 2 of 15 frames shown (3 of 9)]
[frame 8/15]
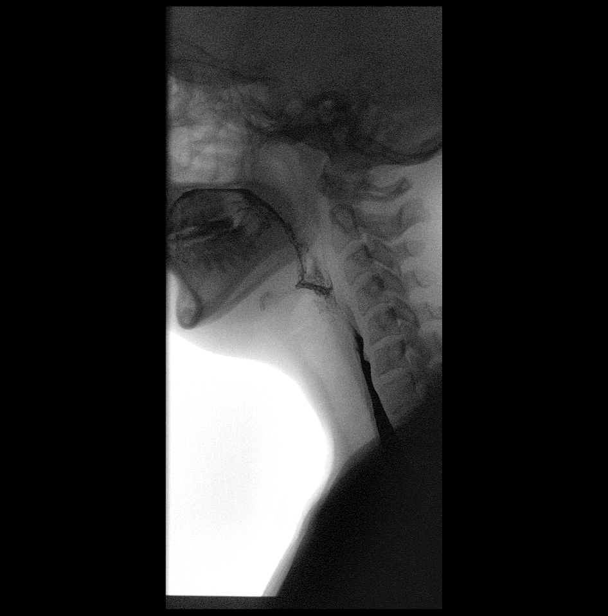
[frame 13/15]
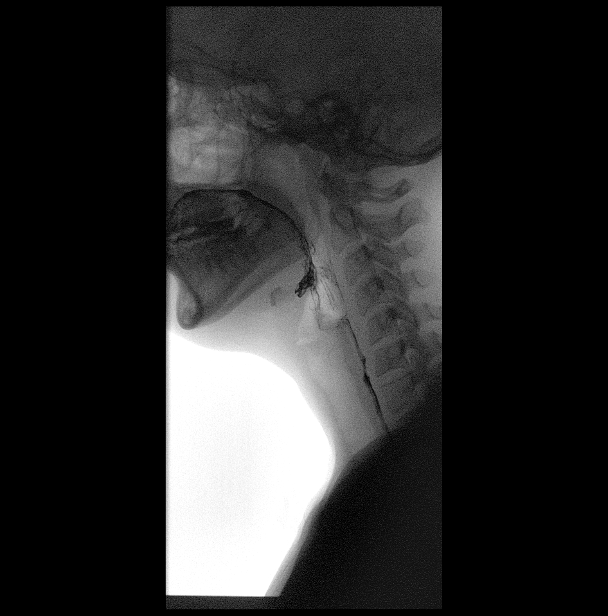

[Series 4: cp_standard · 0.25mm/px · 1 of 56 frames shown (4 of 9)]
[frame 29/56]
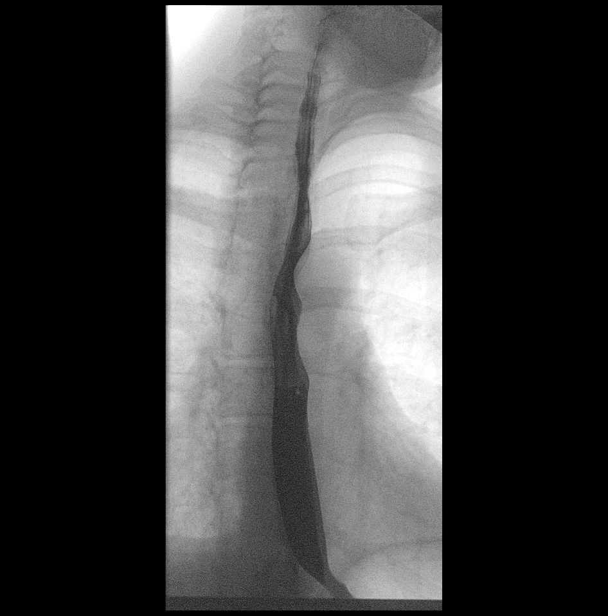

[Series 5: cp_standard · 0.25mm/px · 2 of 58 frames shown (5 of 9)]
[frame 20/58]
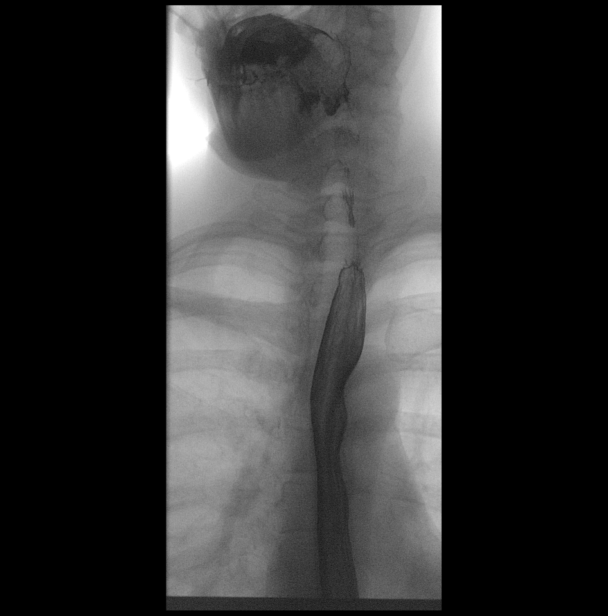
[frame 50/58]
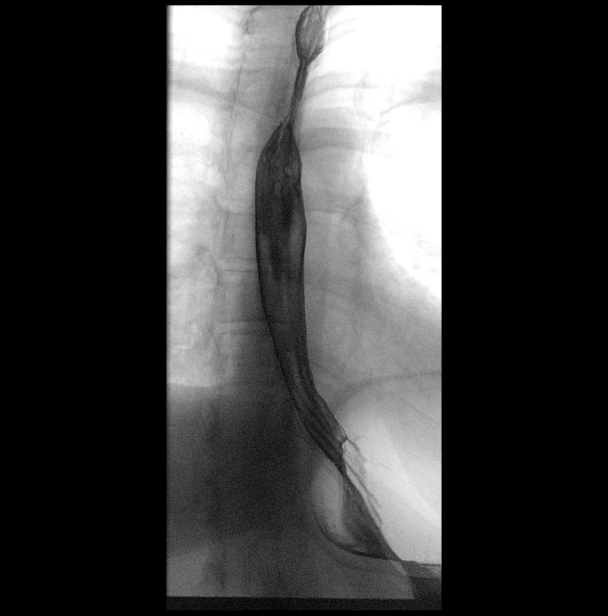

[Series 6: cp_standard · 0.25mm/px · 1 of 37 frames shown (6 of 9)]
[frame 19/37]
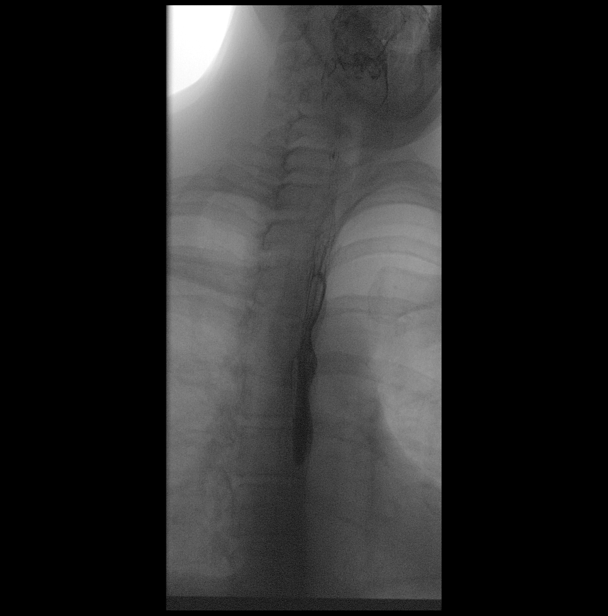

[Series 8: cp_standard · 0.26mm/px · 2 of 19 frames shown (7 of 9)]
[frame 3/19]
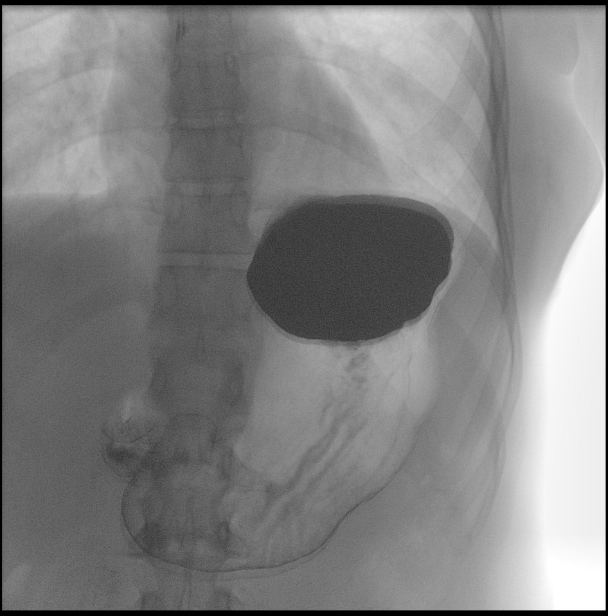
[frame 17/19]
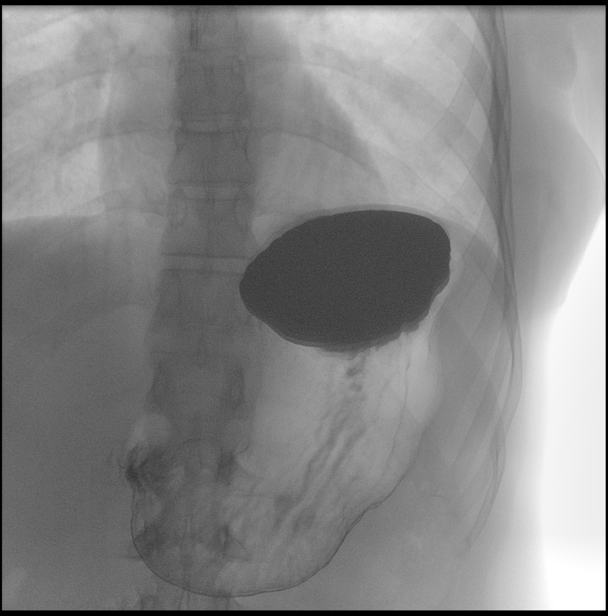

[Series 9: cp_standard · 0.26mm/px · 1 of 75 frames shown (8 of 9)]
[frame 38/75]
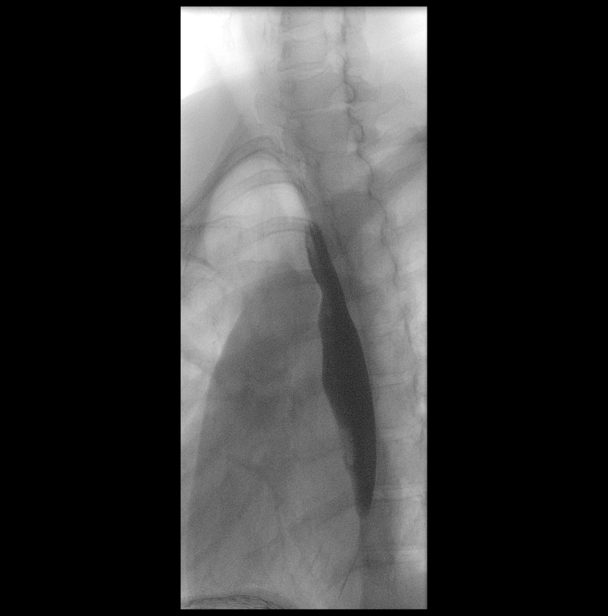

[Series 10: cp_standard · 0.26mm/px · 2 of 55 frames shown (9 of 9)]
[frame 1/55]
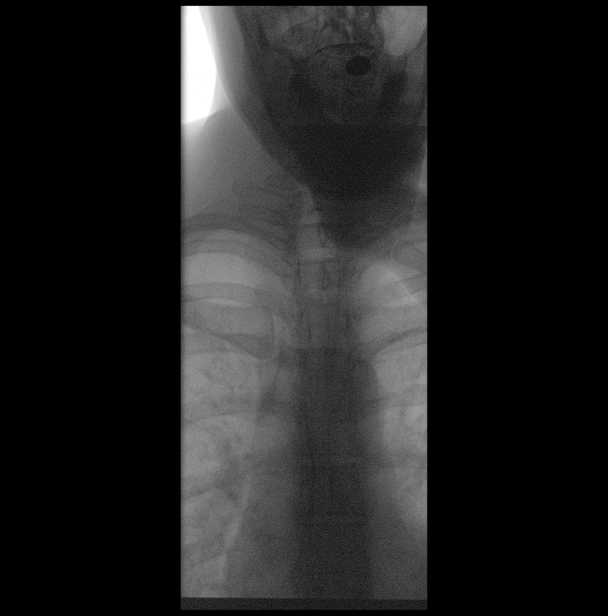
[frame 47/55]
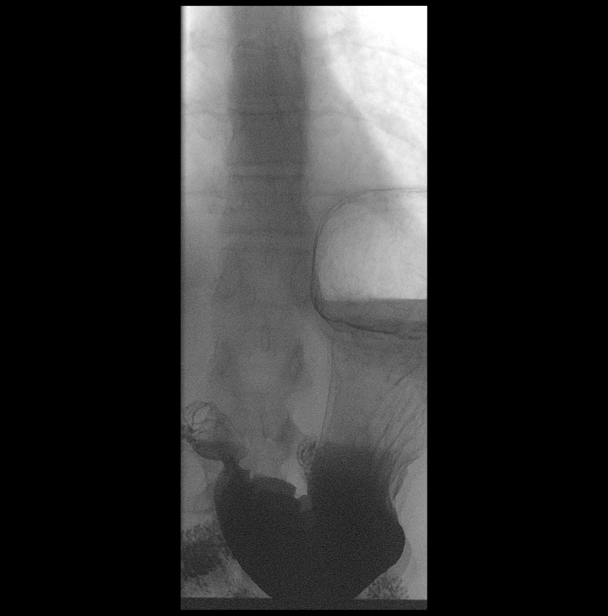

[14 of 24 positions shown; findings below may reference images not displayed]

FINDINGS: Pharynx is normal in appearance and function.

The esophagus is normal in appearance and function with no
diverticuli, masses, strictures, or obvious mucosal abnormalities.

The barium tablet passed normally at the end of the study.

A single limited view of the stomach is normal in appearance.
IMPRESSION: No cause for the patient's symptoms identified. The pharynx,
esophagus, and stomach are normal in appearance.

## 2020-10-13 ENCOUNTER — Other Ambulatory Visit: Payer: Self-pay

## 2020-10-13 ENCOUNTER — Telehealth (INDEPENDENT_AMBULATORY_CARE_PROVIDER_SITE_OTHER): Payer: BC Managed Care – PPO | Admitting: Family

## 2020-10-13 DIAGNOSIS — G43809 Other migraine, not intractable, without status migrainosus: Secondary | ICD-10-CM

## 2020-10-13 DIAGNOSIS — J019 Acute sinusitis, unspecified: Secondary | ICD-10-CM | POA: Diagnosis not present

## 2020-10-13 MED ORDER — ONDANSETRON 8 MG PO TBDP
8.0000 mg | ORAL_TABLET | Freq: Three times a day (TID) | ORAL | 0 refills | Status: DC | PRN
Start: 2020-10-13 — End: 2023-02-14

## 2020-10-13 MED ORDER — AMOXICILLIN-POT CLAVULANATE 875-125 MG PO TABS: 1.0000 | ORAL_TABLET | Freq: Two times a day (BID) | ORAL | 0 refills | Status: AC

## 2020-10-13 NOTE — Progress Notes (Signed)
Colleen Taylor is a 40 y.o. female with the following history as recorded in Epic[employer]:  Patient Active Problem List   Diagnosis Date Noted  . Dysphagia   . Finger injury 01/13/2019  . Vitamin D deficiency 12/31/2017  . 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 Twin pregnancy 01/14/2017  . Hernia of abdominal cavity 01/02/2016    Current Outpatient Medications  Medication Sig Dispense Refill  . amoxicillin-clavulanate (AUGMENTIN) 875-125 MG tablet Take 1 tablet by mouth 2 (two) times daily for 10 days. 20 tablet 0  . ondansetron (ZOFRAN ODT) 8 MG disintegrating tablet Take 1 tablet (8 mg total) by mouth every 8 (eight) hours as needed for nausea or vomiting. 20 tablet 0  . Multiple Vitamin (MULTIVITAMIN) capsule Take 1 capsule by mouth daily.    Marland Kitchen omeprazole (PRILOSEC) 40 MG capsule Take 40 mg by mouth daily.    . SUMAtriptan (IMITREX) 50 MG tablet TAKE 1 TABLET BY MOUTH EVERY 2 HRS AS NEEDED FOR MIGRAINE. MAY REPEAT IN 2 HRS IF HEADACHE PERSISTS 9 tablet 0  . valACYclovir (VALTREX) 1000 MG tablet Take 2 tablets (2,000 mg total) by mouth 2 (two) times daily. For one day. 20 tablet 1   No current facility-administered medications for this visit.    Allergies: Iodine and Shrimp [shellfish allergy]  Past Medical History:  Diagnosis Date  . Anxiety   . Migraines   . Umbilical hernia   . UTI (urinary tract infection)     Past Surgical History:  Procedure Laterality Date  . ABDOMINAL SURGERY    . BREAST REDUCTION SURGERY  2002  . CESAREAN SECTION  2010, 2014  . ESOPHAGOGASTRODUODENOSCOPY (EGD) WITH PROPOFOL N/A 02/01/2020   Procedure: ESOPHAGOGASTRODUODENOSCOPY (EGD) WITH PROPOFOL;  Surgeon: Pasty Spillers, MD;  Location: [facility] ENDOS[facility];  Service: Endoscopy;  Laterality: N/A;  . HAND SURGERY    . SHOULDER SURGERY    . TUBAL LIGATION      Family History  Problem Relation Age of Onset  . Hypertension Mother   . Hypertension Father   . Hearing  loss Father   . Hypertension Sister   . Depression Sister   . Supraventricular tachycardia Sister   . Miscarriages / Stillbirths Sister   . Supraventricular tachycardia Maternal Aunt   . Supraventricular tachycardia Maternal Uncle   . Supraventricular tachycardia Paternal Aunt   . Supraventricular tachycardia Paternal Uncle   . Cancer Paternal Grandfather   . Hearing loss Paternal Grandfather     Social History   Tobacco Use  . Smoking status: Never Smoker  . Smokeless tobacco: Never Used  Substance Use Topics  . Alcohol use: Yes    Alcohol/week: 0.0 standard drinks    Comment: occasionally    Subjective:   I connected with Colleen Taylor on 10/13/20 at 10:00 AM EST by a telephone call and verified that I am speaking with the correct person using two identifiers.   I discussed the limitations of evaluation and management by telemedicine and the availability of in person appointments. The patient expressed understanding and agreed to proceed. Provider in office/ patient is at home; provider and patient are only 2 people on telephone call.   1 week history of sinus pain/ pressure; feels like it has progressed into a sinus infection this morning; having increased migraines/ nausea as well; took COVID test this am and was negative. Does get sinus infections 1-2 x per year;   LMP- 2 weeks ago/ BTL  Objective:  There were  no vitals filed for this visit.  Lungs: Respirations unlabored;  Neurologic: Alert and oriented; speech intact;   Assessment:  1. Acute sinusitis, recurrence not specified, unspecified location   2. Other migraine without status migrainosus, not intractable     Plan:  Rx for Augmentin 875 mg bid x 10 days; recommend to use OTC Claritin D; increase fluids, rest and follow-up worse, no better. Rx for Zofran to help with nausea;   Time spent 10 minutes  No follow-ups on file.  No orders of the defined types were placed in this encounter.   Requested  Prescriptions   Signed Prescriptions Disp Refills  . amoxicillin-clavulanate (AUGMENTIN) 875-125 MG tablet 20 tablet 0    Sig: Take 1 tablet by mouth 2 (two) times daily for 10 days.  . ondansetron (ZOFRAN ODT) 8 MG disintegrating tablet 20 tablet 0    Sig: Take 1 tablet (8 mg total) by mouth every 8 (eight) hours as needed for nausea or vomiting.

## 2022-11-13 ENCOUNTER — Other Ambulatory Visit: Payer: Self-pay | Admitting: Dentistry

## 2022-11-13 DIAGNOSIS — M26632 Articular disc disorder of left temporomandibular joint: Secondary | ICD-10-CM

## 2022-11-15 ENCOUNTER — Encounter: Payer: Self-pay | Admitting: Family Medicine

## 2022-11-27 ENCOUNTER — Ambulatory Visit
Admission: RE | Admit: 2022-11-27 | Discharge: 2022-11-27 | Disposition: A | Discharge status: Home / Self Care | Payer: BC Managed Care – PPO | Source: Ambulatory Visit | Attending: Dentistry | Admitting: Dentistry

## 2022-11-27 DIAGNOSIS — M26632 Articular disc disorder of left temporomandibular joint: Secondary | ICD-10-CM

## 2022-12-01 ENCOUNTER — Other Ambulatory Visit: Payer: BC Managed Care – PPO

## 2023-02-14 ENCOUNTER — Other Ambulatory Visit: Payer: Self-pay | Admitting: Family

## 2023-02-14 ENCOUNTER — Ambulatory Visit: Payer: BC Managed Care – PPO | Admitting: Family

## 2023-02-14 ENCOUNTER — Encounter: Payer: Self-pay | Admitting: Family

## 2023-02-14 VITALS — BP 120/78 | HR 90 | Temp 97.7°F | Ht 65.0 in | Wt 150.8 lb

## 2023-02-14 DIAGNOSIS — E7800 Pure hypercholesterolemia, unspecified: Secondary | ICD-10-CM | POA: Diagnosis not present

## 2023-02-14 DIAGNOSIS — E559 Vitamin D deficiency, unspecified: Secondary | ICD-10-CM

## 2023-02-14 DIAGNOSIS — Z0000 Encounter for general adult medical examination without abnormal findings: Secondary | ICD-10-CM

## 2023-02-14 DIAGNOSIS — M26609 Unspecified temporomandibular joint disorder, unspecified side: Secondary | ICD-10-CM

## 2023-02-14 DIAGNOSIS — B009 Herpesviral infection, unspecified: Secondary | ICD-10-CM | POA: Diagnosis not present

## 2023-02-14 DIAGNOSIS — B001 Herpesviral vesicular dermatitis: Secondary | ICD-10-CM

## 2023-02-14 DIAGNOSIS — G43839 Menstrual migraine, intractable, without status migrainosus: Secondary | ICD-10-CM

## 2023-02-14 DIAGNOSIS — Z1231 Encounter for screening mammogram for malignant neoplasm of breast: Secondary | ICD-10-CM

## 2023-02-14 LAB — CBC
HCT: 40.9 % (ref 36.0–46.0)
Hemoglobin: 13.8 g/dL (ref 12.0–15.0)
MCHC: 33.8 g/dL (ref 30.0–36.0)
MCV: 92.3 fl (ref 78.0–100.0)
Platelets: 238 10*3/uL (ref 150.0–400.0)
RBC: 4.43 Mil/uL (ref 3.87–5.11)
RDW: 12.6 % (ref 11.5–15.5)
WBC: 4.7 10*3/uL (ref 4.0–10.5)

## 2023-02-14 LAB — BASIC METABOLIC PANEL
BUN: 16 mg/dL (ref 6–23)
CO2: 28 mEq/L (ref 19–32)
Calcium: 9.1 mg/dL (ref 8.4–10.5)
Chloride: 105 mEq/L (ref 96–112)
Creatinine, Ser: 1.01 mg/dL (ref 0.40–1.20)
GFR: 69.49 mL/min (ref >60.00)
Glucose, Bld: 96 mg/dL (ref 70–99)
Potassium: 4 mEq/L (ref 3.5–5.1)
Sodium: 139 mEq/L (ref 135–145)

## 2023-02-14 LAB — LIPID PANEL
Cholesterol: 177 mg/dL (ref 0–200)
HDL: 54.8 mg/dL (ref >39.00)
LDL Cholesterol: 108 mg/dL — ABNORMAL HIGH (ref 0–99)
NonHDL: 122.61
Total CHOL/HDL Ratio: 3
Triglycerides: 74 mg/dL (ref 0.0–149.0)
VLDL: 14.8 mg/dL (ref 0.0–40.0)

## 2023-02-14 LAB — VITAMIN D 25 HYDROXY (VIT D DEFICIENCY, FRACTURES): VITD: 28.39 ng/mL — ABNORMAL LOW (ref 30.00–100.00)

## 2023-02-14 MED ORDER — SUMATRIPTAN SUCCINATE 50 MG PO TABS: ORAL_TABLET | ORAL | 2 refills | Status: DC

## 2023-02-14 MED ORDER — VALACYCLOVIR HCL 1 G PO TABS: ORAL_TABLET | ORAL | 1 refills | Status: AC

## 2023-02-14 MED ORDER — VITAMIN D (ERGOCALCIFEROL) 1.25 MG (50000 UNIT) PO CAPS: 50000.0000 [IU] | ORAL_CAPSULE | ORAL | 0 refills | Status: AC

## 2023-02-14 NOTE — Progress Notes (Signed)
New Patient Office Visit  Subjective:  Patient ID: AFI CHESTANG, female    DOB: Oct 29, 1980  Age: 42 y.o. MRN: [id number]  CC:  Chief Complaint  Patient presents with   Establish Care    HPI Colleen Taylor is here to establish care as a new patient as well as here for annual exam. Utd on dental and eye exams. No known h/o STDS  Exercise: five days a week, strength training and cardio  Regular diet.  Mammogram: never had now age 77.   Oriented to practice routines and expectations.  Prior provider was: Deboraha Sprang NP   Pt is without acute concerns.   Pap, has not been in the last three years.   Herpetic lesion left lower lip. Out of valtrex.   chronic concerns:  Migraines: managed per her now that she understands her triggers better. She typically only gets them as menstrual migraines. Sumatriptans helps a lot otherwise uses ibuprofen. Last episode last night however one prior was in January 2024.     ROS: Negative unless specifically indicated above in HPI.   Current Outpatient Medications:    b complex vitamins capsule, Take 1 capsule by mouth daily., Disp: , Rfl:    CALCIUM-MAG-VIT C-VIT D PO, Take 1 tablet by mouth daily., Disp: , Rfl:    Multiple Vitamin (MULTIVITAMIN) capsule, Take 1 capsule by mouth daily., Disp: , Rfl:    SUMAtriptan (IMITREX) 50 MG tablet, Sig: TAKE 1 TABLET by mouth x one AS NEEDED FOR MIGRAINE. MAY REPEAT IN 2 HRS IF HEADACHE PERSISTS, Disp: 10 tablet, Rfl: 2   valACYclovir (VALTREX) 1000 MG tablet, Take two tablets po bid for one dose prn outbreak, Disp: 30 tablet, Rfl: 1 Past Medical History:  Diagnosis Date   Anxiety    Migraines    Umbilical hernia    UTI (urinary tract infection)    Past Surgical History:  Procedure Laterality Date   ABDOMINAL SURGERY     hernia repair and tummy tuck   BREAST REDUCTION SURGERY  2002   CESAREAN SECTION  2010, 2014   ESOPHAGOGASTRODUODENOSCOPY (EGD) WITH PROPOFOL N/A 02/01/2020   Procedure:  ESOPHAGOGASTRODUODENOSCOPY (EGD) WITH PROPOFOL;  Surgeon: Pasty Spillers, MD;  Location: [facility] END[facility];  Service: Endoscopy;  Laterality: N/A;   HAND SURGERY     right   SHOULDER SURGERY Right    fell off a horse   TUBAL LIGATION      Objective:   Today's Vitals: BP 120/78   Pulse 90   Temp 97.7 F (36.5 C) (Temporal)   Ht 5\' 5"  (1.651 m)   Wt 150 lb 12.8 oz (68.4 kg)   LMP 02/14/2023   SpO2 99%   BMI 25.09 kg/m   Physical Exam Constitutional:      General: She is not in acute distress.    Appearance: Normal appearance. She is normal weight. She is not ill-appearing.  HENT:     Head: Normocephalic.     Right Ear: Tympanic membrane normal.     Left Ear: Tympanic membrane normal.     Nose: Nose normal.     Mouth/Throat:     Mouth: Mucous membranes are moist.  Eyes:     Extraocular Movements: Extraocular movements intact.     Pupils: Pupils are equal, round, and reactive to light.  Cardiovascular:     Rate and Rhythm: Normal rate and regular rhythm.  Pulmonary:     Effort: Pulmonary effort is normal.     Breath  sounds: Normal breath sounds.  Abdominal:     General: Abdomen is flat. Bowel sounds are normal.     Palpations: Abdomen is soft.     Tenderness: There is no guarding or rebound.  Musculoskeletal:        General: Normal range of motion.     Cervical back: Normal range of motion.  Skin:    General: Skin is warm.     Capillary Refill: Capillary refill takes less than 2 seconds.  Neurological:     General: No focal deficit present.     Mental Status: She is alert.  Psychiatric:        Mood and Affect: Mood normal.        Behavior: Behavior normal.        Thought Content: Thought content normal.        Judgment: Judgment normal.     Assessment & Plan:  Intractable menstrual migraine without status migrainosus Assessment & Plan: Refill sumatriptan  Stable. Doing well. Menstrual related  Orders: -     SUMAtriptan Succinate; Sig: TAKE 1 TABLET  by mouth x one AS NEEDED FOR MIGRAINE. MAY REPEAT IN 2 HRS IF HEADACHE PERSISTS  Dispense: 10 tablet; Refill: 2  Recurrent cold sores -     valACYclovir HCl; Take two tablets po bid for one dose prn outbreak  Dispense: 30 tablet; Refill: 1  HSV-1 (herpes simplex virus 1) infection Assessment & Plan: Refill valtrex 1 g    TMJ dysfunction Assessment & Plan: Wears bite block  Has seen specialist but however now stable.    Elevated LDL cholesterol level -     Lipid panel  Screening mammogram for breast cancer -     3D Screening Mammogram, Left and Right; Future  Encounter for general adult medical examination without abnormal findings Assessment & Plan: Patient Counseling(The following topics were reviewed):  Preventative care handout given to pt  Health maintenance and immunizations reviewed. Please refer to Health maintenance section. Pt advised on safe sex, wearing seatbelts in car, and proper nutrition labwork ordered today for annual Dental health: Discussed importance of regular tooth brushing, flossing, and dental visits.   Orders: -     VITAMIN D 25 Hydroxy (Vit-D Deficiency, Fractures) -     Lipid panel -     3D Screening Mammogram, Left and Right; Future -     CBC -     Basic metabolic panel  Vitamin D deficiency -     VITAMIN D 25 Hydroxy (Vit-D Deficiency, Fractures)    Follow-up: Return in about 1 year (around 02/14/2024) for f/u CPE.   Mort Sawyers, FNP

## 2023-02-14 NOTE — Assessment & Plan Note (Signed)
Wears bite block  Has seen specialist but however now stable.

## 2023-02-14 NOTE — Assessment & Plan Note (Signed)
Patient Counseling(The following topics were reviewed): ? Preventative care handout given to pt  ?Health maintenance and immunizations reviewed. Please refer to Health maintenance section. ?Pt advised on safe sex, wearing seatbelts in car, and proper nutrition ?labwork ordered today for annual ?Dental health: Discussed importance of regular tooth brushing, flossing, and dental visits. ? ? ?

## 2023-02-14 NOTE — Assessment & Plan Note (Signed)
Refill valtrex 1 g  

## 2023-02-14 NOTE — Assessment & Plan Note (Signed)
Refill sumatriptan  Stable. Doing well. Menstrual related

## 2023-02-14 NOTE — Patient Instructions (Signed)
------------------------------------   I have sent an electronic order over to your preferred location for the following:   []   2D Mammogram  [x]   3D Mammogram  []   Bone Density   Please give this center a call to get scheduled at your convenience.  [x]   [facility]  [address] [place] [address]  [phone]  Make sure to wear two piece  clothing  No lotions powders or deodorants the day of the appointment Make sure to bring picture ID and insurance card.  Bring list of medications you are currently taking including any supplements.    ------------------------------------   Regards,   Mort Sawyers FNP-C

## 2023-03-09 ENCOUNTER — Other Ambulatory Visit: Payer: Self-pay | Admitting: Family

## 2023-03-09 DIAGNOSIS — E559 Vitamin D deficiency, unspecified: Secondary | ICD-10-CM

## 2023-04-09 ENCOUNTER — Ambulatory Visit
Admission: RE | Admit: 2023-04-09 | Discharge: 2023-04-09 | Disposition: A | Discharge status: Home / Self Care | Payer: BC Managed Care – PPO | Source: Ambulatory Visit | Attending: Family | Admitting: Family

## 2023-04-09 DIAGNOSIS — Z1231 Encounter for screening mammogram for malignant neoplasm of breast: Secondary | ICD-10-CM | POA: Diagnosis not present

## 2023-04-09 DIAGNOSIS — Z0000 Encounter for general adult medical examination without abnormal findings: Secondary | ICD-10-CM | POA: Diagnosis present

## 2024-03-13 ENCOUNTER — Other Ambulatory Visit: Payer: Self-pay | Admitting: Family

## 2024-03-13 DIAGNOSIS — G43839 Menstrual migraine, intractable, without status migrainosus: Secondary | ICD-10-CM

## 2024-03-15 NOTE — Telephone Encounter (Signed)
 lvm for pt to call office to schedule appt.

## 2024-03-16 NOTE — Telephone Encounter (Signed)
 Lvmtcb, sent mychart message

## 2024-03-17 NOTE — Telephone Encounter (Signed)
 Spoke to pt, pt stated she's currently on vacation, she'll call office back next week when she's returned back into town
